# Patient Record
Sex: Female | Born: 1975 | Race: Black or African American | Hispanic: No | Marital: Married | Smoking: Current some day smoker
Health system: Southern US, Community
[De-identification: ages and names within clinical notes are randomized; demographics above are authoritative.]

## PROBLEM LIST (undated history)

## (undated) DIAGNOSIS — O99119 Other diseases of the blood and blood-forming organs and certain disorders involving the immune mechanism complicating pregnancy, unspecified trimester: Secondary | ICD-10-CM

## (undated) DIAGNOSIS — D62 Acute posthemorrhagic anemia: Secondary | ICD-10-CM

## (undated) DIAGNOSIS — D696 Thrombocytopenia, unspecified: Secondary | ICD-10-CM

## (undated) HISTORY — PX: ANKLE FRACTURE SURGERY: SHX122

---

## 2010-07-02 ENCOUNTER — Inpatient Hospital Stay (HOSPITAL_COMMUNITY)
Admission: AD | Admit: 2010-07-02 | Discharge: 2010-07-02 | Payer: Self-pay | Source: Home / Self Care | Attending: Obstetrics and Gynecology | Admitting: Obstetrics and Gynecology

## 2010-07-23 LAB — HIV ANTIBODY (ROUTINE TESTING W REFLEX): HIV: NONREACTIVE

## 2010-07-23 LAB — RPR: RPR: NONREACTIVE

## 2010-11-27 ENCOUNTER — Other Ambulatory Visit (HOSPITAL_COMMUNITY): Payer: Self-pay | Admitting: Obstetrics and Gynecology

## 2010-11-27 ENCOUNTER — Ambulatory Visit (HOSPITAL_COMMUNITY): Payer: Self-pay

## 2010-11-27 ENCOUNTER — Encounter (HOSPITAL_COMMUNITY): Payer: Self-pay

## 2010-11-27 ENCOUNTER — Other Ambulatory Visit (HOSPITAL_COMMUNITY): Payer: Self-pay

## 2010-11-27 ENCOUNTER — Ambulatory Visit (HOSPITAL_COMMUNITY)
Admission: RE | Admit: 2010-11-27 | Discharge: 2010-11-27 | Disposition: A | Discharge status: Home / Self Care | Payer: BC Managed Care – PPO | Source: Ambulatory Visit | Attending: Obstetrics and Gynecology | Admitting: Obstetrics and Gynecology

## 2010-11-27 DIAGNOSIS — O2690 Pregnancy related conditions, unspecified, unspecified trimester: Secondary | ICD-10-CM

## 2010-11-27 DIAGNOSIS — O365990 Maternal care for other known or suspected poor fetal growth, unspecified trimester, not applicable or unspecified: Secondary | ICD-10-CM | POA: Insufficient documentation

## 2010-11-27 DIAGNOSIS — O09529 Supervision of elderly multigravida, unspecified trimester: Secondary | ICD-10-CM | POA: Insufficient documentation

## 2010-12-04 ENCOUNTER — Other Ambulatory Visit (HOSPITAL_COMMUNITY): Payer: BC Managed Care – PPO

## 2010-12-04 ENCOUNTER — Other Ambulatory Visit (HOSPITAL_COMMUNITY): Payer: Self-pay | Admitting: Obstetrics and Gynecology

## 2010-12-04 ENCOUNTER — Ambulatory Visit (HOSPITAL_COMMUNITY)
Admission: RE | Admit: 2010-12-04 | Discharge: 2010-12-04 | Disposition: A | Discharge status: Home / Self Care | Payer: BC Managed Care – PPO | Source: Ambulatory Visit | Attending: Obstetrics and Gynecology | Admitting: Obstetrics and Gynecology

## 2010-12-04 DIAGNOSIS — O365990 Maternal care for other known or suspected poor fetal growth, unspecified trimester, not applicable or unspecified: Secondary | ICD-10-CM | POA: Insufficient documentation

## 2010-12-04 DIAGNOSIS — O09529 Supervision of elderly multigravida, unspecified trimester: Secondary | ICD-10-CM | POA: Insufficient documentation

## 2010-12-04 DIAGNOSIS — IMO0002 Reserved for concepts with insufficient information to code with codable children: Secondary | ICD-10-CM

## 2010-12-04 DIAGNOSIS — O288 Other abnormal findings on antenatal screening of mother: Secondary | ICD-10-CM

## 2010-12-04 DIAGNOSIS — O2690 Pregnancy related conditions, unspecified, unspecified trimester: Secondary | ICD-10-CM

## 2010-12-11 ENCOUNTER — Other Ambulatory Visit (HOSPITAL_COMMUNITY): Payer: Self-pay | Admitting: Obstetrics and Gynecology

## 2010-12-11 ENCOUNTER — Ambulatory Visit (HOSPITAL_COMMUNITY)
Admission: RE | Admit: 2010-12-11 | Discharge: 2010-12-11 | Disposition: A | Discharge status: Home / Self Care | Payer: BC Managed Care – PPO | Source: Ambulatory Visit | Attending: Obstetrics and Gynecology | Admitting: Obstetrics and Gynecology

## 2010-12-11 DIAGNOSIS — O365990 Maternal care for other known or suspected poor fetal growth, unspecified trimester, not applicable or unspecified: Secondary | ICD-10-CM | POA: Insufficient documentation

## 2010-12-11 DIAGNOSIS — IMO0002 Reserved for concepts with insufficient information to code with codable children: Secondary | ICD-10-CM

## 2010-12-11 DIAGNOSIS — O288 Other abnormal findings on antenatal screening of mother: Secondary | ICD-10-CM

## 2010-12-11 DIAGNOSIS — O09529 Supervision of elderly multigravida, unspecified trimester: Secondary | ICD-10-CM | POA: Insufficient documentation

## 2010-12-18 ENCOUNTER — Ambulatory Visit (HOSPITAL_COMMUNITY)
Admission: RE | Admit: 2010-12-18 | Discharge: 2010-12-18 | Disposition: A | Discharge status: Home / Self Care | Payer: BC Managed Care – PPO | Source: Ambulatory Visit | Attending: Obstetrics and Gynecology | Admitting: Obstetrics and Gynecology

## 2010-12-18 ENCOUNTER — Other Ambulatory Visit (HOSPITAL_COMMUNITY): Payer: Self-pay | Admitting: Obstetrics and Gynecology

## 2010-12-18 DIAGNOSIS — O09529 Supervision of elderly multigravida, unspecified trimester: Secondary | ICD-10-CM | POA: Insufficient documentation

## 2010-12-18 DIAGNOSIS — IMO0002 Reserved for concepts with insufficient information to code with codable children: Secondary | ICD-10-CM

## 2010-12-18 DIAGNOSIS — O365990 Maternal care for other known or suspected poor fetal growth, unspecified trimester, not applicable or unspecified: Secondary | ICD-10-CM | POA: Insufficient documentation

## 2010-12-25 ENCOUNTER — Other Ambulatory Visit (HOSPITAL_COMMUNITY): Payer: Self-pay | Admitting: Obstetrics and Gynecology

## 2010-12-25 ENCOUNTER — Ambulatory Visit (HOSPITAL_COMMUNITY)
Admission: RE | Admit: 2010-12-25 | Discharge: 2010-12-25 | Disposition: A | Discharge status: Home / Self Care | Payer: BC Managed Care – PPO | Source: Ambulatory Visit | Attending: Obstetrics and Gynecology | Admitting: Obstetrics and Gynecology

## 2010-12-25 DIAGNOSIS — O365990 Maternal care for other known or suspected poor fetal growth, unspecified trimester, not applicable or unspecified: Secondary | ICD-10-CM | POA: Insufficient documentation

## 2010-12-25 DIAGNOSIS — O99330 Smoking (tobacco) complicating pregnancy, unspecified trimester: Secondary | ICD-10-CM | POA: Insufficient documentation

## 2010-12-25 DIAGNOSIS — IMO0002 Reserved for concepts with insufficient information to code with codable children: Secondary | ICD-10-CM

## 2010-12-25 DIAGNOSIS — O288 Other abnormal findings on antenatal screening of mother: Secondary | ICD-10-CM

## 2010-12-25 DIAGNOSIS — O09529 Supervision of elderly multigravida, unspecified trimester: Secondary | ICD-10-CM | POA: Insufficient documentation

## 2010-12-31 ENCOUNTER — Other Ambulatory Visit (HOSPITAL_COMMUNITY): Payer: Self-pay | Admitting: Obstetrics and Gynecology

## 2010-12-31 DIAGNOSIS — IMO0002 Reserved for concepts with insufficient information to code with codable children: Secondary | ICD-10-CM

## 2011-01-01 ENCOUNTER — Encounter (HOSPITAL_COMMUNITY): Payer: Self-pay

## 2011-01-01 ENCOUNTER — Ambulatory Visit (HOSPITAL_COMMUNITY): Payer: BC Managed Care – PPO

## 2011-01-01 ENCOUNTER — Ambulatory Visit (HOSPITAL_COMMUNITY)
Admission: RE | Admit: 2011-01-01 | Discharge: 2011-01-01 | Disposition: A | Discharge status: Home / Self Care | Payer: BC Managed Care – PPO | Source: Ambulatory Visit | Attending: Obstetrics and Gynecology | Admitting: Obstetrics and Gynecology

## 2011-01-01 DIAGNOSIS — O09529 Supervision of elderly multigravida, unspecified trimester: Secondary | ICD-10-CM | POA: Insufficient documentation

## 2011-01-01 DIAGNOSIS — O99330 Smoking (tobacco) complicating pregnancy, unspecified trimester: Secondary | ICD-10-CM | POA: Insufficient documentation

## 2011-01-01 DIAGNOSIS — IMO0002 Reserved for concepts with insufficient information to code with codable children: Secondary | ICD-10-CM

## 2011-01-01 DIAGNOSIS — O288 Other abnormal findings on antenatal screening of mother: Secondary | ICD-10-CM

## 2011-01-01 DIAGNOSIS — O365990 Maternal care for other known or suspected poor fetal growth, unspecified trimester, not applicable or unspecified: Secondary | ICD-10-CM | POA: Insufficient documentation

## 2011-01-08 ENCOUNTER — Other Ambulatory Visit (HOSPITAL_COMMUNITY): Payer: Self-pay | Admitting: Obstetrics and Gynecology

## 2011-01-08 ENCOUNTER — Other Ambulatory Visit (HOSPITAL_COMMUNITY): Payer: BC Managed Care – PPO

## 2011-01-08 ENCOUNTER — Ambulatory Visit (HOSPITAL_COMMUNITY)
Admission: RE | Admit: 2011-01-08 | Discharge: 2011-01-08 | Disposition: A | Discharge status: Home / Self Care | Payer: BC Managed Care – PPO | Source: Ambulatory Visit | Attending: Obstetrics and Gynecology | Admitting: Obstetrics and Gynecology

## 2011-01-08 DIAGNOSIS — IMO0002 Reserved for concepts with insufficient information to code with codable children: Secondary | ICD-10-CM

## 2011-01-08 DIAGNOSIS — O09529 Supervision of elderly multigravida, unspecified trimester: Secondary | ICD-10-CM | POA: Insufficient documentation

## 2011-01-08 DIAGNOSIS — O365990 Maternal care for other known or suspected poor fetal growth, unspecified trimester, not applicable or unspecified: Secondary | ICD-10-CM | POA: Insufficient documentation

## 2011-01-08 DIAGNOSIS — O99330 Smoking (tobacco) complicating pregnancy, unspecified trimester: Secondary | ICD-10-CM | POA: Insufficient documentation

## 2011-01-08 NOTE — Progress Notes (Signed)
Patient seen for ultrasound only appointment today.  Please see [facility]-OBGYN report for details.  

## 2011-01-15 ENCOUNTER — Ambulatory Visit (HOSPITAL_COMMUNITY): Payer: BC Managed Care – PPO

## 2011-01-15 ENCOUNTER — Other Ambulatory Visit (HOSPITAL_COMMUNITY): Payer: BC Managed Care – PPO

## 2011-01-17 ENCOUNTER — Encounter (HOSPITAL_COMMUNITY)
Admission: AD | Disposition: A | Discharge status: Home / Self Care | Payer: Self-pay | Source: Ambulatory Visit | Attending: Obstetrics

## 2011-01-17 ENCOUNTER — Encounter (HOSPITAL_COMMUNITY): Payer: Self-pay | Admitting: *Deleted

## 2011-01-17 ENCOUNTER — Other Ambulatory Visit (HOSPITAL_COMMUNITY): Payer: Self-pay | Admitting: Obstetrics and Gynecology

## 2011-01-17 ENCOUNTER — Inpatient Hospital Stay (HOSPITAL_COMMUNITY): Payer: BC Managed Care – PPO | Admitting: Anesthesiology

## 2011-01-17 ENCOUNTER — Inpatient Hospital Stay (HOSPITAL_COMMUNITY)
Admission: AD | Admit: 2011-01-17 | Discharge: 2011-01-20 | DRG: 370 | Disposition: A | Discharge status: Home / Self Care | Payer: BC Managed Care – PPO | Source: Ambulatory Visit | Attending: Obstetrics | Admitting: Obstetrics

## 2011-01-17 ENCOUNTER — Encounter (HOSPITAL_COMMUNITY): Payer: Self-pay | Admitting: Anesthesiology

## 2011-01-17 ENCOUNTER — Ambulatory Visit (HOSPITAL_COMMUNITY)
Admission: RE | Admit: 2011-01-17 | Discharge: 2011-01-17 | Disposition: A | Discharge status: Home / Self Care | Payer: BC Managed Care – PPO | Source: Ambulatory Visit | Attending: Obstetrics and Gynecology | Admitting: Obstetrics and Gynecology

## 2011-01-17 DIAGNOSIS — O365990 Maternal care for other known or suspected poor fetal growth, unspecified trimester, not applicable or unspecified: Secondary | ICD-10-CM | POA: Insufficient documentation

## 2011-01-17 DIAGNOSIS — D696 Thrombocytopenia, unspecified: Secondary | ICD-10-CM | POA: Diagnosis present

## 2011-01-17 DIAGNOSIS — O09529 Supervision of elderly multigravida, unspecified trimester: Secondary | ICD-10-CM | POA: Insufficient documentation

## 2011-01-17 DIAGNOSIS — O288 Other abnormal findings on antenatal screening of mother: Secondary | ICD-10-CM

## 2011-01-17 DIAGNOSIS — O99119 Other diseases of the blood and blood-forming organs and certain disorders involving the immune mechanism complicating pregnancy, unspecified trimester: Secondary | ICD-10-CM | POA: Diagnosis present

## 2011-01-17 DIAGNOSIS — D689 Coagulation defect, unspecified: Secondary | ICD-10-CM | POA: Diagnosis not present

## 2011-01-17 DIAGNOSIS — IMO0002 Reserved for concepts with insufficient information to code with codable children: Secondary | ICD-10-CM

## 2011-01-17 DIAGNOSIS — O99330 Smoking (tobacco) complicating pregnancy, unspecified trimester: Secondary | ICD-10-CM | POA: Insufficient documentation

## 2011-01-17 HISTORY — DX: Thrombocytopenia, unspecified: D69.6

## 2011-01-17 HISTORY — DX: Other diseases of the blood and blood-forming organs and certain disorders involving the immune mechanism complicating pregnancy, unspecified trimester: O99.119

## 2011-01-17 LAB — CBC
HCT: 35.9 % — ABNORMAL LOW (ref 36.0–46.0)
Hemoglobin: 12.1 g/dL (ref 12.0–15.0)
WBC: 11.2 10*3/uL — ABNORMAL HIGH (ref 4.0–10.5)

## 2011-01-17 LAB — TYPE AND SCREEN
ABO/RH(D): B POS
Antibody Screen: NEGATIVE
Antibody Screen: NEGATIVE

## 2011-01-17 LAB — HEPATITIS B SURFACE ANTIGEN: Hepatitis B Surface Ag: NEGATIVE

## 2011-01-17 LAB — STREP B DNA PROBE

## 2011-01-17 LAB — HIV ANTIBODY (ROUTINE TESTING W REFLEX): HIV: NONREACTIVE

## 2011-01-17 SURGERY — Surgical Case
Anesthesia: Spinal | Site: Abdomen | Wound class: Clean Contaminated

## 2011-01-17 MED ORDER — MORPHINE SULFATE 0.5 MG/ML IJ SOLN
INTRAMUSCULAR | Status: AC
  Filled 2011-01-17: qty 10

## 2011-01-17 MED ORDER — BUPIVACAINE IN DEXTROSE 0.75-8.25 % IT SOLN
INTRATHECAL | Status: DC | PRN
  Administered 2011-01-17: 1.5 mL via INTRATHECAL

## 2011-01-17 MED ORDER — OXYTOCIN 20 UNITS IN LACTATED RINGERS INFUSION - SIMPLE
INTRAVENOUS | Status: DC | PRN
  Administered 2011-01-17: 20 [IU] via INTRAVENOUS

## 2011-01-17 MED ORDER — OXYTOCIN 20 UNITS IN LACTATED RINGERS INFUSION - SIMPLE
INTRAVENOUS | Status: AC
  Filled 2011-01-17: qty 1000

## 2011-01-17 MED ORDER — CITRIC ACID-SODIUM CITRATE 334-500 MG/5ML PO SOLN
ORAL | Status: AC
  Administered 2011-01-17: 30 mL
  Filled 2011-01-17: qty 15

## 2011-01-17 MED ORDER — FENTANYL CITRATE 0.05 MG/ML IJ SOLN
INTRAMUSCULAR | Status: AC
  Filled 2011-01-17: qty 2

## 2011-01-17 MED ORDER — EPHEDRINE SULFATE 50 MG/ML IJ SOLN
INTRAMUSCULAR | Status: DC | PRN
  Administered 2011-01-17: 15 mg via INTRAVENOUS

## 2011-01-17 MED ORDER — MORPHINE SULFATE (PF) 0.5 MG/ML IJ SOLN
INTRAMUSCULAR | Status: DC | PRN
  Administered 2011-01-17: .1 mg via INTRATHECAL

## 2011-01-17 MED ORDER — ONDANSETRON HCL 4 MG/2ML IJ SOLN
INTRAMUSCULAR | Status: AC
  Filled 2011-01-17: qty 2

## 2011-01-17 MED ORDER — ONDANSETRON HCL 4 MG/2ML IJ SOLN
INTRAMUSCULAR | Status: DC | PRN
  Administered 2011-01-17: 4 mg via INTRAVENOUS

## 2011-01-17 MED ORDER — OXYTOCIN 10 UNIT/ML IJ SOLN
INTRAMUSCULAR | Status: AC
  Filled 2011-01-17: qty 2

## 2011-01-17 MED ORDER — EPHEDRINE 5 MG/ML INJ
INTRAVENOUS | Status: AC
  Filled 2011-01-17: qty 10

## 2011-01-17 MED ORDER — LACTATED RINGERS IV SOLN
INTRAVENOUS | Status: DC
  Administered 2011-01-17 (×4): via INTRAVENOUS

## 2011-01-17 MED ORDER — FENTANYL CITRATE 0.05 MG/ML IJ SOLN
INTRAMUSCULAR | Status: DC | PRN
  Administered 2011-01-17: 15 ug via INTRATHECAL

## 2011-01-17 SURGICAL SUPPLY — 30 items
CHLORAPREP W/TINT 26ML (MISCELLANEOUS) ×2 IMPLANT
CLOTH BEACON ORANGE TIMEOUT ST (SAFETY) ×2 IMPLANT
CONTAINER PREFILL 10% NBF 15ML (MISCELLANEOUS) IMPLANT
DRSG COVADERM 4X8 (GAUZE/BANDAGES/DRESSINGS) ×2 IMPLANT
ELECT REM PT RETURN 9FT ADLT (ELECTROSURGICAL) ×2
ELECTRODE REM PT RTRN 9FT ADLT (ELECTROSURGICAL) ×1 IMPLANT
EXTRACTOR VACUUM KIWI (MISCELLANEOUS) IMPLANT
EXTRACTOR VACUUM M CUP 4 TUBE (SUCTIONS) IMPLANT
GLOVE BIO SURGEON STRL SZ 6.5 (GLOVE) ×2 IMPLANT
GLOVE BIOGEL PI IND STRL 7.0 (GLOVE) ×2 IMPLANT
GLOVE BIOGEL PI INDICATOR 7.0 (GLOVE) ×2
GOWN PREVENTION PLUS LG XLONG (DISPOSABLE) ×6 IMPLANT
KIT ABG SYR 3ML LUER SLIP (SYRINGE) IMPLANT
NEEDLE HYPO 25X5/8 SAFETYGLIDE (NEEDLE) IMPLANT
NS IRRIG 1000ML POUR BTL (IV SOLUTION) ×2 IMPLANT
PACK C SECTION WH (CUSTOM PROCEDURE TRAY) ×2 IMPLANT
SLEEVE SCD COMPRESS KNEE MED (MISCELLANEOUS) IMPLANT
STAPLER VISISTAT 35W (STAPLE) IMPLANT
STRIP CLOSURE SKIN 1/2X4 (GAUZE/BANDAGES/DRESSINGS) IMPLANT
SUT MON AB 4-0 PS1 27 (SUTURE) IMPLANT
SUT PLAIN 0 NONE (SUTURE) IMPLANT
SUT PLAIN 2 0 XLH (SUTURE) IMPLANT
SUT VIC AB 0 CT1 36 (SUTURE) ×4 IMPLANT
SUT VIC AB 0 CTX 36 (SUTURE) ×3
SUT VIC AB 0 CTX36XBRD ANBCTRL (SUTURE) ×3 IMPLANT
SUT VIC AB 2-0 CT1 27 (SUTURE) ×1
SUT VIC AB 2-0 CT1 TAPERPNT 27 (SUTURE) ×1 IMPLANT
TOWEL OR 17X24 6PK STRL BLUE (TOWEL DISPOSABLE) ×4 IMPLANT
TRAY FOLEY CATH 14FR (SET/KITS/TRAYS/PACK) IMPLANT
WATER STERILE IRR 1000ML POUR (IV SOLUTION) ×2 IMPLANT

## 2011-01-17 NOTE — Anesthesia Procedure Notes (Signed)
Spinal Block  Patient location during procedure: OR Start time: 01/17/2011 11:20 PM Staffing Performed by: anesthesiologist  Preanesthetic Checklist Completed: patient identified, site marked, surgical consent, pre-op evaluation, timeout performed, IV checked, risks and benefits discussed and monitors and equipment checked Spinal Block Patient position: sitting Prep: DuraPrep Patient monitoring: heart rate, cardiac monitor, continuous pulse ox and blood pressure Approach: midline Location: L3-4 Injection technique: single-shot Needle Needle type: Sprotte  Needle gauge: 24 G Needle length: 5 cm Assessment Sensory level: T4 Additional Notes One attempt.

## 2011-01-17 NOTE — H&P (Signed)
[name] [name] is a 35 y.o. G4P1021 at [redacted]w[redacted]d by LMP c/w 6 wk u/s, presenting for fetal eval given reversed EDF seen on u/s earlier today. Pt has been followed for IUGR since 27 wks, unexplained, and has been getting twice weekly UA dopplers. Fetal testing has remained stable until today, when decreased AFI (5.6) absent with reversed EDF noted. BPP 8/8 but given that pt is greater than 34 wks, s/p BMZ and reversal of flow, discussion was had with Dr. Tona Sensing and decision made to move toward delivery. Pt notes no contractions . Good fetal movement, No vaginal bleeding, No leaking fluid.  [facility] at [name] since 6 wks - marginal cord insertion - IUGR as noted - DS 166, nl 3 hr GTT  OB History    Grav Para Term Preterm Abortions TAB SAB Ect Mult Living   4 1 1  0 2 1 1  0 0 1     History reviewed. 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 No pertinent past surgical history. Family History: family history is not on file. Social History:  reports that she has been smoking Cigarettes.  She has a 2.5 pack-year smoking history. She has never used smokeless tobacco. She reports that she does not drink alcohol or use illicit drugs.  Review of Systems - Negative     Blood pressure 122/80, pulse 64, temperature 98.6 F (37 C), temperature source Oral, resp. rate 20, height 5\' 2"  (1.575 m), weight 89.812 kg (198 lb), last menstrual period 05/18/2010.  Physical Exam:  Vitals noted Gen- well appearing, no distress CV_ RRR Pulm- CTAB Abd- soft, gravid, NT GU: cvx long /closed/ vtx high Toco: rare ctx FH: 130's, + accels, 10 beat variability  (after initial hour where minimal variability), variable decel w/ rare ctx, times durin NST where FH audibly stops for up to 10 seconds (maternal FH in 90's traces) then resumes. Decel when pt on back. LE: NT, no edema  U/s: MFM u/s today: BPP 8/8, absent w/ reversed EDF, AFI 5.6 but subjectively decreased and lower  than prior U/s: MFM 7/24: 3'7, vtx  Prenatal labs: ABO, Rh:  B+ Antibody: Negative (08/02 0000) Rubella:  RI RPR: Nonreactive, Nonreactive (08/02 0000)  HBsAg: Negative (08/02 0000)  HIV: Non-reactive (08/02 0000)  GBS: Unknown (08/02 0000)  1 hr Glucola 166, nl 3 hr  Genetic screening nl ultrascreen, AFP neg Anatomy US normal   Assessment/Plan: 34 y.o. [id number] at [redacted]w[redacted]d FWB- IUGR since 27 wks, now w/ new drops in AFI along with absent and reversed EDF. Plan to move to delivery when weighing risks of continued expectant management with risks of prematurity at 34+ weeks. Pt has also refused inpt monitoring in past and risk of pt non-compliance with monitoring worrisome. Dr. Tona Sensing in agreement with decision for delivery.  - MOD. D/w pt option for CST and IOL if passes CST vs PCS. As she has already had decels with rare ctx, had decel with position change and audible stops in FH during NST, along with unripe cvx we have made the decision to proceed with PCS. We discussed her favorable factor of prior vaginal delivery, though this was 12 yrs ago.    - Ancef prophylaxis, GBS unknown  Abdallah Hern A. 01/17/2011, 10:50 PM

## 2011-01-17 NOTE — Anesthesia Preprocedure Evaluation (Signed)
Anesthesia Evaluation  Name, MR# and DOB Patient awake  General Assessment Comment  Reviewed: Allergy & Precautions, H&P , Patient's Chart, lab work & pertinent test results and reviewed documented beta blocker date and time   History of Anesthesia Complications Negative for: history of anesthetic complications  Airway Mallampati: III TM Distance: >3 FB Neck ROM: full    Dental  (+) Teeth Intact   Pulmonary  clear to auscultation    Cardiovascular regular Normal   Neuro/Psych  GI/Hepatic/Renal   Endo/Other   Abdominal   Musculoskeletal  Hematology Thrombocytopenia noted today for the first time - 108   Peds  Reproductive/Obstetrics (+) Pregnancy   Anesthesia Other Findings 34 weeks, severe IUGR, absent EDF, failed CST with NRFS - now for urgent C/S            Anesthesia Physical Anesthesia Plan  ASA: II and Emergent  Anesthesia Plan: Spinal   Post-op Pain Management:    Induction:   Airway Management Planned:   Additional Equipment:   Intra-op Plan:   Post-operative Plan:   Informed Consent: I have reviewed the patients History and Physical, chart, labs and discussed the procedure including the risks, benefits and alternatives for the proposed anesthesia with the patient or authorized representative who has indicated his/her understanding and acceptance.     Plan Discussed with: Surgeon and CRNA  Anesthesia Plan Comments:         Anesthesia Quick Evaluation

## 2011-01-17 NOTE — Progress Notes (Signed)
Report in [facility]-OBGYN/EPIC; IUGR with decreasing AFI and abnormal Doppler velocimetry.  Biophysical profile is 8/8 Recommended hospitalization for further assessment, possible delivery.

## 2011-01-17 NOTE — Consult Note (Signed)
Called to attend this estimated 34.[redacted] week gestation delivery with past h/o severe IUGR presenting tonight with reversed end-diastolic flow and non reassuring FHR.  Mother received BMZ @ ~ [redacted] weeks gestation.  Estimated fetal weight is 3 # 7 oz ~ 7 days ago.  Mother seen by MFM consultant yesterday and based on the observation of reversed end diastolic flow and perhaps other observations, recommendation was made to hospitalize mother for closer observation. Admitted to hospital today and on FHR monitoring observed to have hon reassuring tracing. On this basis decision to perform urgent controlled C/S.Marland Kitchen  At delivery membranes were ruptured (clear) and infant extracted from a vertex presentation. INfant immediately had a cry and active movement of upper extremities. Repeated cries and active MAE observed when placed under radiant warmer. Skin dried vigorously with room temperature having been raised for the brief trip from the OR table, naso/oropharynx had copious secretions that required repeated bulb suctioning. Infant transferred to a prewarmed dry blanket and observed for a normal transition of 5 minutes which passed reasonably well.  She did require the additional of BBO2 beginning at 2 minutes of age; this was continued through transport to the NICU.   She voided during this interval.    Discussed details of management with father once arriving in NICU and addressed his questions.

## 2011-01-18 ENCOUNTER — Encounter (HOSPITAL_COMMUNITY): Payer: Self-pay | Admitting: Anesthesiology

## 2011-01-18 ENCOUNTER — Ambulatory Visit (HOSPITAL_COMMUNITY): Payer: BC Managed Care – PPO

## 2011-01-18 ENCOUNTER — Other Ambulatory Visit: Payer: Self-pay | Admitting: Obstetrics

## 2011-01-18 ENCOUNTER — Encounter (HOSPITAL_COMMUNITY): Payer: Self-pay | Admitting: *Deleted

## 2011-01-18 DIAGNOSIS — O365990 Maternal care for other known or suspected poor fetal growth, unspecified trimester, not applicable or unspecified: Secondary | ICD-10-CM

## 2011-01-18 LAB — CBC
HCT: 32.9 % — ABNORMAL LOW (ref 36.0–46.0)
MCH: 27.3 pg (ref 26.0–34.0)
MCV: 82.5 fL (ref 78.0–100.0)
Platelets: 91 10*3/uL — ABNORMAL LOW (ref 150–400)
RDW: 14.7 % (ref 11.5–15.5)

## 2011-01-18 MED ORDER — ZOLPIDEM TARTRATE 5 MG PO TABS: 5.0000 mg | ORAL_TABLET | Freq: Every evening | ORAL | Status: DC | PRN

## 2011-01-18 MED ORDER — DIPHENHYDRAMINE HCL 25 MG PO CAPS: 25.0000 mg | ORAL_CAPSULE | ORAL | Status: DC | PRN

## 2011-01-18 MED ORDER — LANOLIN HYDROUS EX OINT: 1.0000 "application " | TOPICAL_OINTMENT | CUTANEOUS | Status: DC | PRN

## 2011-01-18 MED ORDER — PRENATAL PLUS 27-1 MG PO TABS
1.0000 | ORAL_TABLET | Freq: Every day | ORAL | Status: DC
  Administered 2011-01-18 – 2011-01-20 (×3): 1 via ORAL
  Filled 2011-01-18 (×3): qty 1

## 2011-01-18 MED ORDER — ONDANSETRON HCL 4 MG/2ML IJ SOLN: 4.0000 mg | INTRAMUSCULAR | Status: DC | PRN

## 2011-01-18 MED ORDER — SCOPOLAMINE 1 MG/3DAYS TD PT72
1.0000 | MEDICATED_PATCH | Freq: Once | TRANSDERMAL | Status: DC
  Administered 2011-01-18: 1.5 mg via TRANSDERMAL

## 2011-01-18 MED ORDER — IBUPROFEN 600 MG PO TABS
600.0000 mg | ORAL_TABLET | Freq: Four times a day (QID) | ORAL | Status: DC
  Administered 2011-01-18: 600 mg via ORAL
  Filled 2011-01-18 (×2): qty 1

## 2011-01-18 MED ORDER — SENNOSIDES-DOCUSATE SODIUM 8.6-50 MG PO TABS
2.0000 | ORAL_TABLET | Freq: Every day | ORAL | Status: DC
  Administered 2011-01-18 – 2011-01-19 (×2): 2 via ORAL

## 2011-01-18 MED ORDER — MENTHOL 3 MG MT LOZG: 1.0000 | LOZENGE | OROMUCOSAL | Status: DC | PRN

## 2011-01-18 MED ORDER — TETANUS-DIPHTH-ACELL PERTUSSIS 5-2.5-18.5 LF-MCG/0.5 IM SUSP
0.5000 mL | Freq: Once | INTRAMUSCULAR | Status: AC
  Administered 2011-01-20: 0.5 mL via INTRAMUSCULAR
  Filled 2011-01-18: qty 0.5

## 2011-01-18 MED ORDER — NALOXONE HCL 0.4 MG/ML IJ SOLN: 0.4000 mg | INTRAMUSCULAR | Status: DC | PRN

## 2011-01-18 MED ORDER — ONDANSETRON HCL 4 MG PO TABS: 4.0000 mg | ORAL_TABLET | ORAL | Status: DC | PRN

## 2011-01-18 MED ORDER — SODIUM CHLORIDE 0.9 % IV SOLN
1.0000 ug/kg/h | INTRAVENOUS | Status: DC | PRN
  Filled 2011-01-18: qty 2.5

## 2011-01-18 MED ORDER — ONDANSETRON HCL 4 MG/2ML IJ SOLN: 4.0000 mg | Freq: Three times a day (TID) | INTRAMUSCULAR | Status: DC | PRN

## 2011-01-18 MED ORDER — NALBUPHINE HCL 10 MG/ML IJ SOLN
5.0000 mg | INTRAMUSCULAR | Status: DC | PRN
  Filled 2011-01-18: qty 1

## 2011-01-18 MED ORDER — DIPHENHYDRAMINE HCL 50 MG/ML IJ SOLN: 25.0000 mg | INTRAMUSCULAR | Status: DC | PRN

## 2011-01-18 MED ORDER — DIPHENHYDRAMINE HCL 25 MG PO CAPS: 25.0000 mg | ORAL_CAPSULE | Freq: Four times a day (QID) | ORAL | Status: DC | PRN

## 2011-01-18 MED ORDER — WITCH HAZEL-GLYCERIN EX PADS: 1.0000 "application " | MEDICATED_PAD | CUTANEOUS | Status: DC | PRN

## 2011-01-18 MED ORDER — MEPERIDINE HCL 25 MG/ML IJ SOLN: 6.2500 mg | INTRAMUSCULAR | Status: DC | PRN

## 2011-01-18 MED ORDER — OXYCODONE-ACETAMINOPHEN 5-325 MG PO TABS
1.0000 | ORAL_TABLET | ORAL | Status: DC | PRN
  Administered 2011-01-18: 1 via ORAL
  Administered 2011-01-18: 2 via ORAL
  Administered 2011-01-18: 1 via ORAL
  Administered 2011-01-19: 2 via ORAL
  Administered 2011-01-19: 1 via ORAL
  Administered 2011-01-19 – 2011-01-20 (×2): 2 via ORAL
  Filled 2011-01-18: qty 1
  Filled 2011-01-18: qty 2
  Filled 2011-01-18: qty 1
  Filled 2011-01-18: qty 2
  Filled 2011-01-18: qty 1
  Filled 2011-01-18 (×2): qty 2

## 2011-01-18 MED ORDER — HYDROMORPHONE HCL 1 MG/ML IJ SOLN: 0.2500 mg | INTRAMUSCULAR | Status: DC | PRN

## 2011-01-18 MED ORDER — DIBUCAINE 1 % RE OINT: 1.0000 "application " | TOPICAL_OINTMENT | RECTAL | Status: DC | PRN

## 2011-01-18 MED ORDER — SIMETHICONE 80 MG PO CHEW
80.0000 mg | CHEWABLE_TABLET | Freq: Three times a day (TID) | ORAL | Status: DC
  Administered 2011-01-18 – 2011-01-20 (×9): 80 mg via ORAL

## 2011-01-18 MED ORDER — SIMETHICONE 80 MG PO CHEW: 80.0000 mg | CHEWABLE_TABLET | ORAL | Status: DC | PRN

## 2011-01-18 MED ORDER — OXYTOCIN 20 UNITS IN LACTATED RINGERS INFUSION - SIMPLE
INTRAVENOUS | Status: AC
  Administered 2011-01-18: 20000 m[IU] via INTRAVENOUS
  Filled 2011-01-18: qty 1000

## 2011-01-18 MED ORDER — METOCLOPRAMIDE HCL 5 MG/ML IJ SOLN: 10.0000 mg | Freq: Three times a day (TID) | INTRAMUSCULAR | Status: DC | PRN

## 2011-01-18 MED ORDER — DIPHENHYDRAMINE HCL 50 MG/ML IJ SOLN: 12.5000 mg | INTRAMUSCULAR | Status: DC | PRN

## 2011-01-18 MED ORDER — OXYTOCIN 20 UNITS IN LACTATED RINGERS INFUSION - SIMPLE
125.0000 mL/h | INTRAVENOUS | Status: AC
  Administered 2011-01-18: 20000 m[IU] via INTRAVENOUS

## 2011-01-18 MED ORDER — IBUPROFEN 600 MG PO TABS: 600.0000 mg | ORAL_TABLET | Freq: Four times a day (QID) | ORAL | Status: DC | PRN

## 2011-01-18 MED ORDER — SCOPOLAMINE 1 MG/3DAYS TD PT72
MEDICATED_PATCH | TRANSDERMAL | Status: AC
  Administered 2011-01-18: 1.5 mg via TRANSDERMAL
  Filled 2011-01-18: qty 1

## 2011-01-18 MED ORDER — SODIUM CHLORIDE 0.9 % IJ SOLN: 3.0000 mL | INTRAMUSCULAR | Status: DC | PRN

## 2011-01-18 MED ORDER — CEFAZOLIN SODIUM 1-5 GM-% IV SOLN
INTRAVENOUS | Status: AC
  Filled 2011-01-18: qty 50

## 2011-01-18 MED ORDER — LACTATED RINGERS IV SOLN
INTRAVENOUS | Status: DC
  Administered 2011-01-18: 10:00:00 via INTRAVENOUS

## 2011-01-18 NOTE — Progress Notes (Signed)
Preoperative diagnosis: IUGR, 34 wks, fetal decel  Postop diagnosis:  same Procedure: PCS Surgeon: Viviann Spare Assistant:. none Anesthesia: spinal Findings: female infant, wt 3'3, cord ph 7.2, nl uterus, nl tubes, nl ovaries, clear AF,  APGAR (1 MIN): 7   APGAR (5 MINS): 9   EBL: 400 cc Antibiotics:  1 g Ancef  Complications: none  Indications: This is a 35 y.o. year-old, [id number]  At [redacted]w[redacted]d admitted for non-reasurring fetal testing. Risks benefits and alternatives of the procedure were discussed with the patient who agreed to proceed  Procedure:  After informed consent was obtained the patient was taken to the operating room where spinal anesthesia was initiated.  She was prepped and draped in the normal sterile fashion in dorsal supine position with a leftward tilt.  A foley catheter was inserted sterilely into the bladder. Gloves were changes and attention was turned to the patient's abdomen. A Pfannenstiel skin incision was made 2 cm above the pubic symphysis in the midline with the scalpel.  Dissection was carried down with the Bovie cautery until the fascia was reached. The fascia was incised in the midline. The incision was extended laterally with the Mayo scissors. The inferior aspect of the fascial incision was grasped with the Coker clamps, elevated up and the underlying rectus muscles were dissected off sharply. The superior aspect of the fascial incision was grasped with the Coker clamps elevated up and the underlying rectus muscles were dissected off sharply.  The peritoneum was entered bluntly, the pyramidalis muscles were separated sharply in the midline. The peritoneal incision was extended superiorly and inferiorly with good visualization of the bladder. The bladder blade was inserted, the vesicouterine peritoneum was identified grasped with the pickups and entered sharply. The bladder flap was created digitally,  the bladder blade was reinserted. Palpation was done to assess the  fetal position and the location of the uterine vessels. Thought was made to a classical c/s but I felt there was enough lateral room given the vtx presentation to perform a LTCS. The lower segment of the uterus was incised sharply with the scalpel and extended superiorly and laterally with the bandage scissors. The infant also was grasped brought to the incision,  rotated and the infant was delivered with fundal pressure. The nose and mouth were bulb suctioned. The cord was clamped and cut. The infant was handed off to the waiting pediatrician. Cord blood and cord pH were obtained. The placenta was expressed. The uterus was exteriorized. The uterus was cleared of all clots and debris. The uterine incision was repaired with 0 Vicryl in a running locked fashion.  A second layer of the same suture was used in an imbricating fashion to obtain excellent hemostasis. Several additional figure of 8 sutures were placed in the midpoint of the incision. The uterus was then returned to the abdomen, the gutters were cleared of all clots and debris. The uterine incision was reinspected and found to be hemostatic. The peritoneum was grasped and closed with 2-0 Vicryl in a running fashion. The cut muscle edges and the underside of the fascia were inspected and found to be hemostatic. The fascia was closed with 0 Vicryl in two halves. The subcutaneous tissue was irrigated. Scarpa's layer was closed with a 2-0 plain gut suture. The skin was closed with a 4-0 Monocryl in a single layer. The patient tolerated the procedure well. Sponge lap and needle counts were correct x3 and patient was taken to the recovery room in a stable condition.  Korah Hufstedler A. 01/18/2011 12:19 AM

## 2011-01-18 NOTE — Anesthesia Postprocedure Evaluation (Signed)
  Anesthesia Post-op Note  Patient: [name] [name]  Procedure(s) Performed:  CESAREAN SECTION  No anesthesia complications.  Level of consciousness: alert. Cardiopulmonary status stable.  No follow-up care or observation required.  Jamerson Vonbargen L. Rodman Pickle, MD

## 2011-01-18 NOTE — Progress Notes (Signed)
  POD # 1  Subjective: Pt reports feeling tired and sore/ Pain controlled with ibuprofen (OTC); has not received percocet yet Tolerating po/ Foley patent and to be removed today/ No n/v/Flatus neg Activity: out of bed and ambulate Bleeding is light Newborn info:  Information for the patient's newborn:  Winnell, Bento [[id number]]  female  / Feeding: breast and bottle/Infant stable in NICU at present.   Objective: VS: Blood pressure 113/78, pulse 58, temperature 98.2 F (36.8 C), temperature source Oral, resp. rate 20  LABS:  I&O: +500   Basename 01/18/11 0640 01/17/11 2115  WBC 12.6* 11.2*  HGB 10.9* 12.1  HCT 32.9* 35.9*  PLT 91* 108*    Blood type: B POS, B POS (08/02 2208) Rubella: Immune (08/02 0000)    Physical Exam:  General: alert, cooperative and no distress CV: Regular rate and rhythm Resp: clear Abdomen: Soft, appropriately tender; hypoactive BS x 4 quads; Dressing to incision is C/D/I Uterine Fundus: firm, below umbilicus, nontender Lochia: minimal Ext: SCD's in place    A/P: POD # 1/ G4P1122/Primary C/S @ 34 d/t FITL Doing well Thrombocytopenia: D/C Ibuprofen Continue routine post op orders

## 2011-01-18 NOTE — Progress Notes (Signed)
ASSISTED PATIENT WITH PUMPING.  SHE OBTAINED 2-3 CCS OF COLOSTRUM.  PATIENT WORRIED ABOUT ENGORGEMENT DUE TO PROBLEMS WITH LAST BABY.  PATIENT STATES ENGORGEMENT DID NOT RESOLVE EASILY AND SHE LOST MILK SUPPLY.  ENCOURAGED TO CALL LC WITH ASSIST/CONCERNS.

## 2011-01-18 NOTE — Anesthesia Postprocedure Evaluation (Signed)
  Anesthesia Post-op Note  Patient: [name] [name]  Procedure(s) Performed:  CESAREAN SECTION  Patient Location: PACU and Mother/Baby  Anesthesia Type: Epidural  Level of Consciousness: awake, alert  and oriented  Airway and Oxygen Therapy: Patient Spontanous Breathing  Post-op Pain: mild  Post-op Assessment: Patient's Cardiovascular Status Stable and Respiratory Function Stable  Post-op Vital Signs: Reviewed and stable  Complications: No apparent anesthesia complications

## 2011-01-18 NOTE — Brief Op Note (Signed)
01/17/2011 - 01/18/2011  12:13 AM  PATIENT:  [name] [name]  35 y.o. female  PRE-OPERATIVE DIAGNOSIS:  Intrauterine Pregnancy At 66  Weeks; Severe Intrauterine Growth Retardation; Fetal Heart Decelarations  POST-OPERATIVE DIAGNOSIS:  IUP34WKS; IUGR; Primary C-Section Female At 2338; Apgar 7/9  PROCEDURE:  Procedure(s): CESAREAN SECTION  SURGEON:  Surgeon(s): [facility]. Najae Rathert  PHYSICIAN ASSISTANT:   ASSISTANTS: none   ANESTHESIA:   spinal  ESTIMATED BLOOD LOSS: * No blood loss amount entered *   BLOOD ADMINISTERED:none  DRAINS: Urinary Catheter (Foley)   LOCAL MEDICATIONS USED:  NONE  SPECIMEN:  Source of Specimen:  placenta  DISPOSITION OF SPECIMEN:  PATHOLOGY  COUNTS:  YES  TOURNIQUET:  * No tourniquets in log *    PLAN OF CARE: routine post-op, baby to NICU  PATIENT DISPOSITION:  PACU - hemodynamically stable.   Delay start of Pharmacological VTE agent (>24hrs) due to surgical blood loss or risk of bleeding:  yes              01/17/2011 - 01/18/2011  12:13 AM  PATIENT:  [name] [name]  35 y.o. female  PRE-OPERATIVE DIAGNOSIS:  Intrauterine Pregnancy At 45  Weeks; Severe Intrauterine Growth Retardation; Fetal Heart Decelarations  POST-OPERATIVE DIAGNOSIS:  IUP34WKS; IUGR; Primary C-Section Female At 2338; Apgar 7/9  PROCEDURE:  Procedure(s): CESAREAN SECTION  SURGEON:  Surgeon(s): [facility]. Indra Wolters

## 2011-01-18 NOTE — Transfer of Care (Signed)
Immediate Anesthesia Transfer of Care Note  Patient: [name] [name]  Procedure(s) Performed:  CESAREAN SECTION  Patient Location: PACU  Anesthesia Type: Regional and Spinal  Level of Consciousness: awake, alert  and oriented  Airway & Oxygen Therapy: Patient Spontanous Breathing  Post-op Assessment: Report given to PACU RN and Post -op Vital signs reviewed and stable  Post vital signs: Reviewed and stable  Complications: No apparent anesthesia complications

## 2011-01-19 ENCOUNTER — Encounter (HOSPITAL_COMMUNITY): Payer: Self-pay

## 2011-01-19 DIAGNOSIS — D696 Thrombocytopenia, unspecified: Secondary | ICD-10-CM | POA: Diagnosis present

## 2011-01-19 HISTORY — DX: Thrombocytopenia, unspecified: D69.6

## 2011-01-19 NOTE — Progress Notes (Addendum)
  Subjective: POD# 2 Information for the patient's newborn:  [name], [name] [[id number]]  female NICU admit  Reports feeling well, coping with infant in NICU Feeding: breast / pumping Patient reports tolerating PO.  Breast symptoms: filling Pain controlled withnarcotic analgesics including Percocet Denies HA/SOB/C/P/N/V/dizziness. Flatus  present. She reports vaginal bleeding as normal, without clots.  She is ambulating, urinating without difficult.     Objective:   VS: Blood pressure 107/62, pulse 61, temperature 98.3 F (36.8 C), temperature source Oral, resp. rate 18, height 5\' 2"  (1.575 m), weight 89.812 kg (198 lb), last menstrual period 05/18/2010, SpO2 99.00%, unknown if currently breastfeeding.   Intake/Output Summary (Last 24 hours) at 01/19/11 1106 Last data filed at 01/18/11 1800  Gross per 24 hour  Intake 1318.75 ml  Output   2600 ml  Net -1281.25 ml        Basename 01/18/11 0640 01/17/11 2115  WBC 12.6* 11.2*  HGB 10.9* 12.1  HCT 32.9* 35.9*  PLT 91* 108*     Blood type: B POS, B POS (08/02 2208)  Rubella: Immune (08/02 0000)     Physical Exam:  General: alert, cooperative and no distress CV: Regular rate and rhythm Resp: clear Abdomen: soft, nontender, normal bowel sounds Incision: staples to LST, clean, dry, intact and no drainage present Uterine Fundus: firm, below umbilicus, nontender Lochia: minimal Ext: Homans sign is negative, no sign of DVT and trace edema      Assessment/Plan: 35 y.o.  status post Cesarean section. POD# 2.  s/p Cesarean Delivery.  Indications: IUGR and reverse EDF, NRFHT       Gestational thrombocytopenia - no s/s hemorrhage, off NSAIDs, plan rpt plts in am          Stable post-op Coping well with unplanned 1 C/S and NICU infant             Routine post-op care Anticipate discharge in am  [name] [name] 01/19/2011, 11:06 AM

## 2011-01-19 NOTE — Progress Notes (Signed)
Mom pumping upon entering room.  Increased flange size to #30 on left and #36 on right d/t nipple rubbing on smaller flanges.  Mom obtained 3 ml.of colostrum after 30 minutes pumping.    Encouraged good massage before, during, and at end of pumping to increase milk production.  Pumping q3hrs.  Encouraged to pump every 2-3 during the day and once during the night for minimum of 8x/24hrs.  Reviewed engorgement prevention.  Thinking about renting pump.  Pump packet left at bedside with verbal instruction for completing needed rental information.  Mom has been pumping on standard setting.  Explained the preemie setting for use if she wants while here in hospital.  Encouraged to call for assistance.

## 2011-01-20 MED ORDER — IBUPROFEN 800 MG PO TABS: 800.0000 mg | ORAL_TABLET | Freq: Three times a day (TID) | ORAL | Status: AC | PRN

## 2011-01-20 MED ORDER — OXYCODONE-ACETAMINOPHEN 5-325 MG PO TABS: 1.0000 | ORAL_TABLET | ORAL | Status: AC | PRN

## 2011-01-20 NOTE — Discharge Summary (Signed)
Patient ID: [name] [name] MRN: [id number] DOB/AGE: [birth year] 35 y.o.  Admit date: 01/17/2011 Discharge date:  01/20/2011  Admission Diagnoses: IUP at 34 weeks 1 day  IUGR with reversed EDF, decreased AFI Non-reassuring fetal heart tracing Unfavorable cervix  Discharge Diagnoses:  Principal Problem:  *Postpartum care following cesarean delivery Active Problems:  Postpartum care and examination of lactating mother  Gestational thrombocytopenia without hemorrhage   Prenatal history:  Gravida 4 para1021 at 85 1/[redacted] weeks gestation with Estimated Date of Delivery: 02/27/11 Prenatal care at [facility] since [redacted] weeks gestation with Dr. Billy Coast as primary provider.   Prenatal course complicated by marginal cord insertion, IUGR  Prenatal Labs: ABO, Rh: B POS, B POS (08/02 2208) Antibody: NEG (08/02 2208) Rubella:  immune RPR: NON REACTIVE (08/02 2115)  HBsAg: Negative (08/02 0000)  HIV: Non-reactive (08/02 0000)  GBS: Unknown (08/02 0000)  3 hr Glucola : wnl   Medical / Surgical History : Past Medical History  Diagnosis Date  . Postpartum care following cesarean delivery 01/18/2011  . Gestational thrombocytopenia without hemorrhage 01/19/2011   History reviewed. No pertinent past surgical history. Social History:  reports that she has been smoking Cigarettes.  She has a 2.5 pack-year smoking history. She has never used smokeless tobacco. She reports that she does not drink alcohol or use illicit drugs.  Allergies: Review of patient's allergies indicates no known allergies.   Current Medications at time of admission:  Prescriptions prior to admission  Medication Sig Dispense Refill  . Prenatal Vitamins (DIS) TABS Take 1 tablet by mouth daily.          Intrapartum course: IUGR since 27 wks, now w/ new drops in AFI along with absent and reversed EDF. Plan to move to delivery when weighing risks of continued expectant management with risks of prematurity at 34+  weeks. Pt has also refused inpt monitoring in past and risk of pt non-compliance with monitoring worrisome. Dr. Tona Sensing in agreement with decision for delivery.  - MOD. D/w pt option for CST and IOL if passes CST vs PCS. As she has already had decels with rare ctx, had decel with position change and audible stops in FH during NST, along with unripe cvx we have made the decision to proceed with PCS. We discussed her favorable factor of prior vaginal delivery, though this was 12 yrs ago.       - Ancef prophylaxis, GBS unknown    Procedures: Cesarean section delivery of female newborn by Dr Ernestina Penna  See operative report for further details  Postoperative / postpartum course: Gestational thrombocytopenia, improving platelets on POD #3  Physical Exam:  VSS: Blood pressure 114/76, pulse 74, temperature 98 F (36.7 C), temperature source Oral, resp. rate 18, height 5\' 2"  (1.575 m), weight 89.812 kg (198 lb), last menstrual period 05/18/2010, SpO2 99.00%,   LABS:  Lab Results  Component Value Date   HGB 10.9* 01/18/2011                             Lab Results  Component Value Date   PLT 108* 01/20/2011     Incision:  approximated with staples / no erythema / no ecchymosis / no drainage Staples: removed prior to discharge  Discharge Instructions:  Postpartum Instructions: per [name] Booklet - given to patient  Discharged Condition: good  Diet: Regular diet with adequate water hydration.  Discharge Orders    Future Orders Please Complete By Expires  Diet - low sodium heart healthy      Discharge instructions      Comments:   WOB instructions booklet    Strep B DNA probe      Comments:   This external order was created through the Results Console.   CBC      Comments:   This external order was created through the Results Console.   HIV antibody      Comments:   This external order was created through the Results Console.   Rubella antibody, IgM      Comments:    This external order was created through the Results Console.   Hepatitis B surface antigen      Comments:   This external order was created through the Results Console.   RPR      Comments:   This external order was created through the Results Console.   RPR      Comments:   This external order was created through the Results Console.   HIV antibody      Comments:   This external order was created through the Results Console.   RPR      Comments:   This external order was created through the Results Console.   Type and screen      Comments:   This external order was created through the Results Console.   ABO/Rh      Comments:   This external order was created through the Results Console.     Current Discharge Medication List    START taking these medications   Details  ibuprofen (ADVIL,MOTRIN) 800 MG tablet Take 1 tablet (800 mg total) by mouth every 8 (eight) hours as needed for pain. Qty: 60 tablet, Refills: 0    oxyCODONE-acetaminophen (PERCOCET) 5-325 MG per tablet Take 1-2 tablets by mouth every 3 (three) hours as needed (moderate - severe pain). Qty: 30 tablet, Refills: 0      CONTINUE these medications which have NOT CHANGED   Details  Prenatal Vitamins (DIS) TABS Take 1 tablet by mouth daily.         Follow-up Information    Follow up with Lenoard Aden, MD.   Contact information:   [address] [place] [address] [phone]          Disposition: Final discharge disposition not confirmed  PAUL,DANIELA 01/20/2011, 9:12 AM  Reviewed, agree

## 2011-01-20 NOTE — Consult Note (Signed)
Has lansinoh at home.  Encouraged to use symphony in NICU and to rent hospital grade pump if lansinoh does not empty as well.  Expressing about 20-30 ml.  Follow up with LC prn.

## 2011-01-20 NOTE — Progress Notes (Signed)
Subjective: POD# 3 Information for the patient's newborn:  Chalene, Treu [[id number]]  female infant in NICU, stable  Reports feeling well Feeding: breast/pumping Patient reports tolerating PO.  Breast symptoms:milk coming in. Pain controlled withnarcotic analgesics including percocet Denies HA/SOB/C/P/N/V/dizziness. Flatus present. She reports vaginal bleeding as normal, without clots.  She is ambulating, urinating without difficult.     Objective:   VS: Blood pressure 114/76, pulse 74, temperature 98 F (36.7 C), temperature source Oral, resp. rate 18, height 5\' 2"  (1.575 m), weight 89.812 kg (198 lb), last menstrual period 05/18/2010, SpO2 99.00%,  No intake or output data in the 24 hours ending 01/20/11 0902      Basename 01/20/11 0500 01/18/11 0640 01/17/11 2115  WBC -- 12.6* 11.2*  HGB -- 10.9* 12.1  HCT -- 32.9* 35.9*  PLT 108* 91* --     Blood type: B POS,  Rubella: Immune (08/02 0000)     Physical Exam:  General: alert, cooperative and no distress CV: Regular rate and rhythm Resp: clear Abdomen: soft, nontender, normal bowel sounds Incision: staples clean, dry, intact, erythematous and no drainage present Uterine Fundus: firm, below umbilicus, nontender Lochia: minimal Ext: Homans sign is negative, no sign of DVT   Assessment/Plan: 35 y.o.  status post Cesarean section. POD# 3.  s/p Cesarean Delivery.  Indications: fetal distress                Principal Problem:  *Postpartum care following cesarean delivery Active Problems:  Postpartum care and examination of lactating mother  Gestational thrombocytopenia without hemorrhage  Stable/improving platelet count, discussed no NSAIDS for next 3-4 days, then may resume Doing well, stable post-op.    Remove staples today         Plan D/C home today, infant remains in NICU - stable  PAUL,DANIELA 01/20/2011, 9:02 AM

## 2011-01-21 ENCOUNTER — Other Ambulatory Visit (HOSPITAL_COMMUNITY): Payer: BC Managed Care – PPO

## 2011-01-22 NOTE — Progress Notes (Signed)
UR Chart review completed.  

## 2011-01-23 ENCOUNTER — Other Ambulatory Visit (HOSPITAL_COMMUNITY): Payer: BC Managed Care – PPO

## 2011-01-23 ENCOUNTER — Ambulatory Visit (HOSPITAL_COMMUNITY)
Admission: RE | Admit: 2011-01-23 | Discharge: 2011-01-23 | Disposition: A | Discharge status: Home / Self Care | Payer: BC Managed Care – PPO | Source: Ambulatory Visit | Attending: Obstetrics | Admitting: Obstetrics

## 2011-01-29 ENCOUNTER — Ambulatory Visit (HOSPITAL_COMMUNITY): Payer: BC Managed Care – PPO

## 2011-03-13 ENCOUNTER — Encounter (HOSPITAL_COMMUNITY): Payer: Self-pay | Admitting: Obstetrics

## 2012-09-11 IMAGING — US US OB LIMITED
1 series · 14 of 28 positions shown · non-contrast
Comparison: none

[Series 1: us ob limited · 31 acquisitions, 14 frames shown]
[im 2/31]
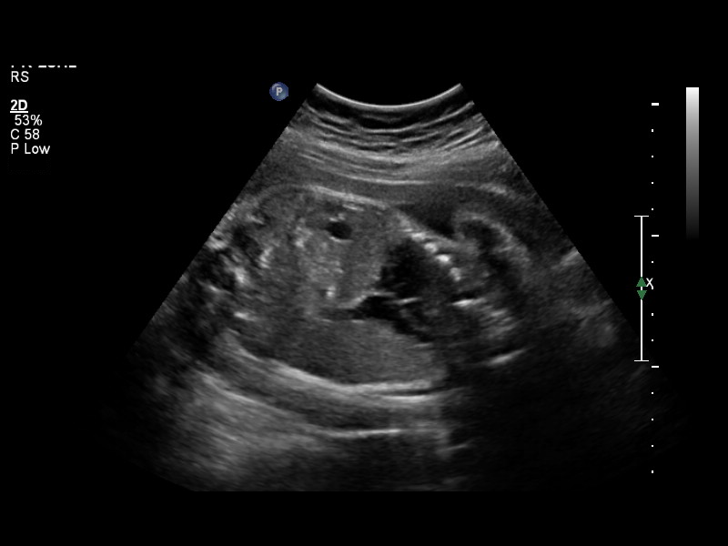
[im 4/31]
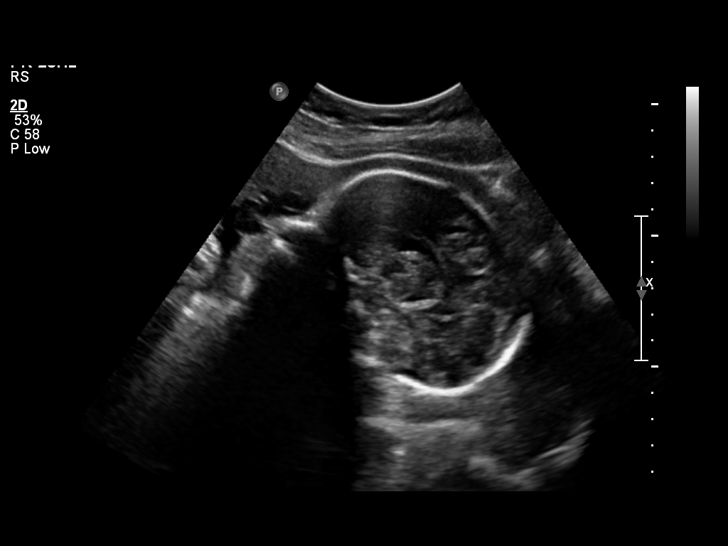
[im 6/31]
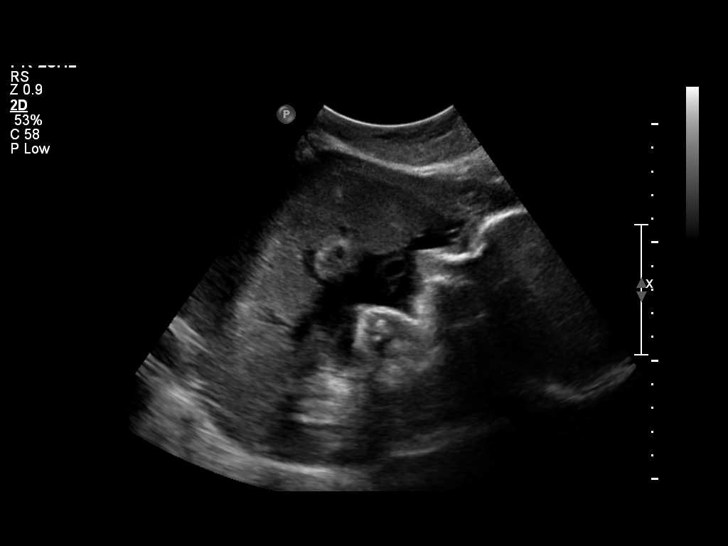
[im 8/31]
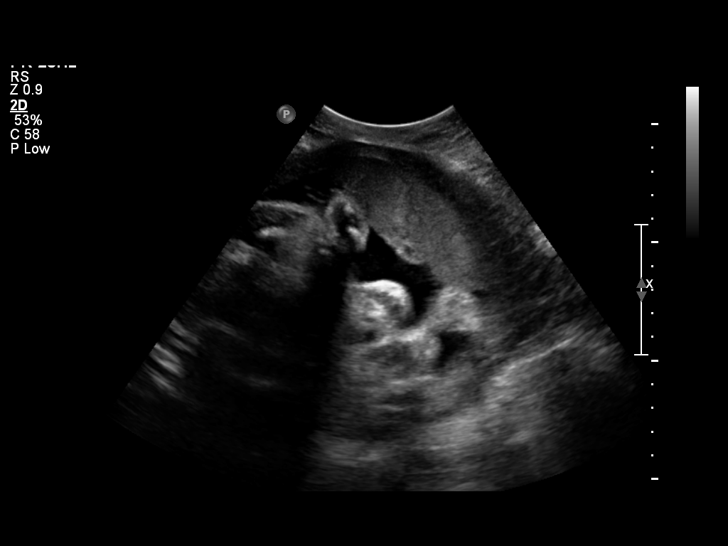
[im 11/31]
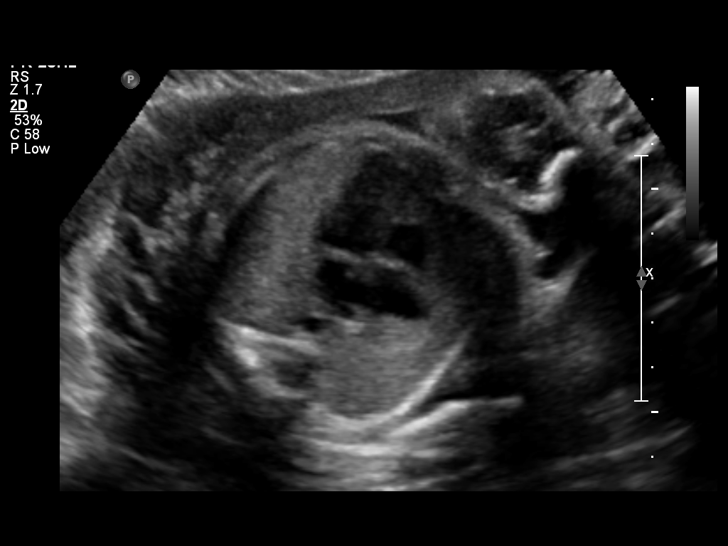
[im 13/31]
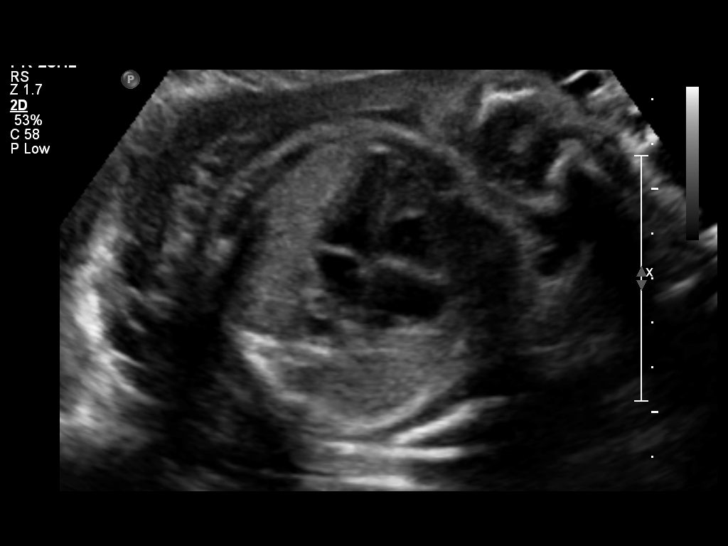
[im 15/31]
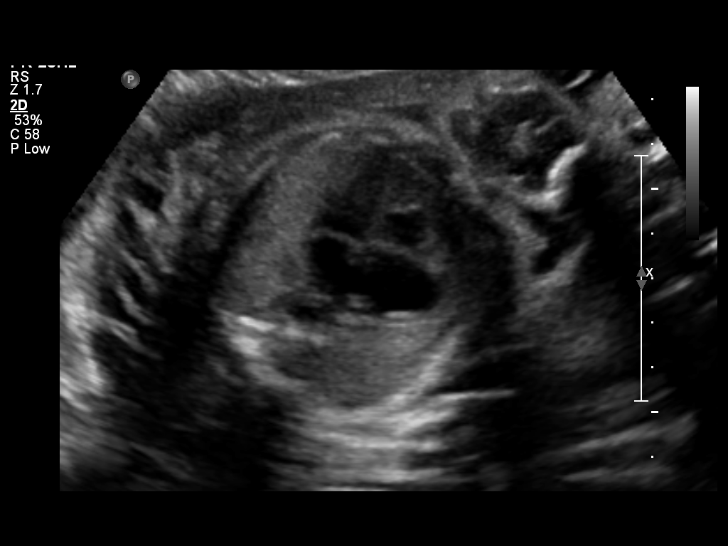
[im 17/31]
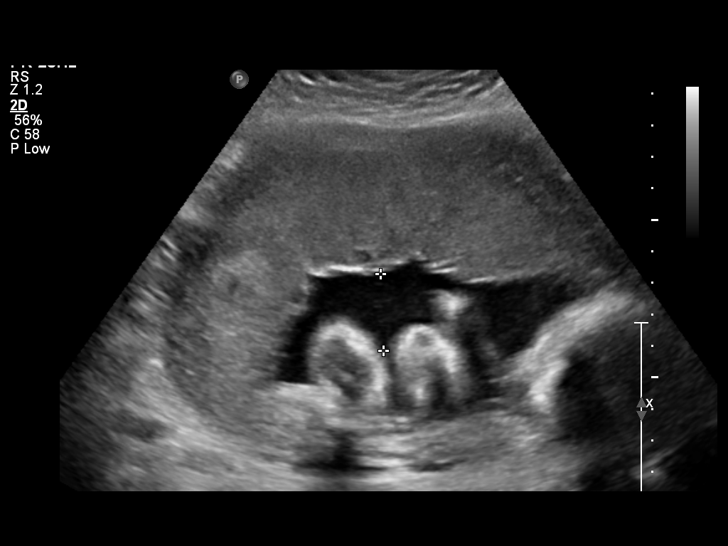
[im 19/31]
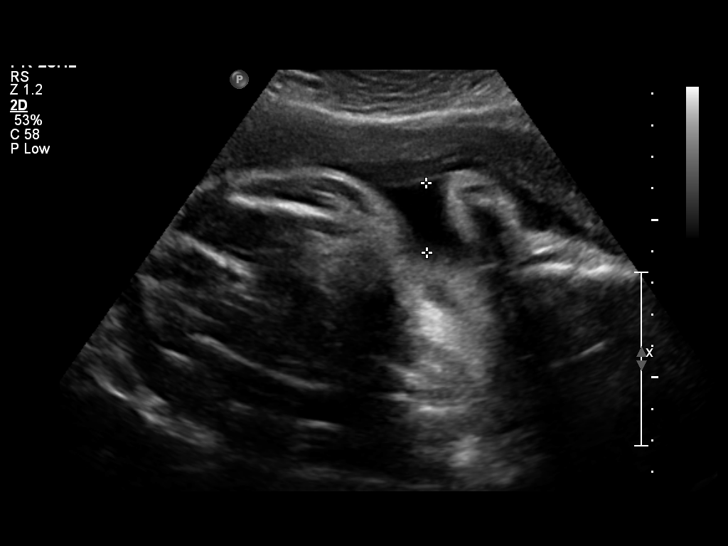
[im 22/31]
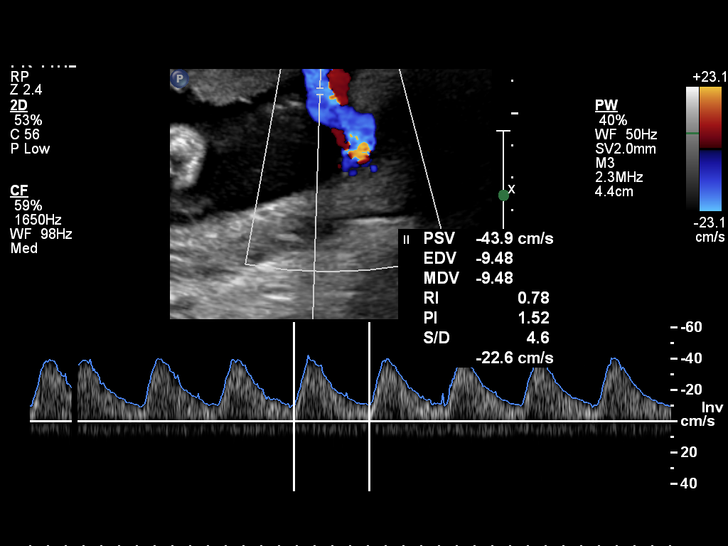
[im 24/31]
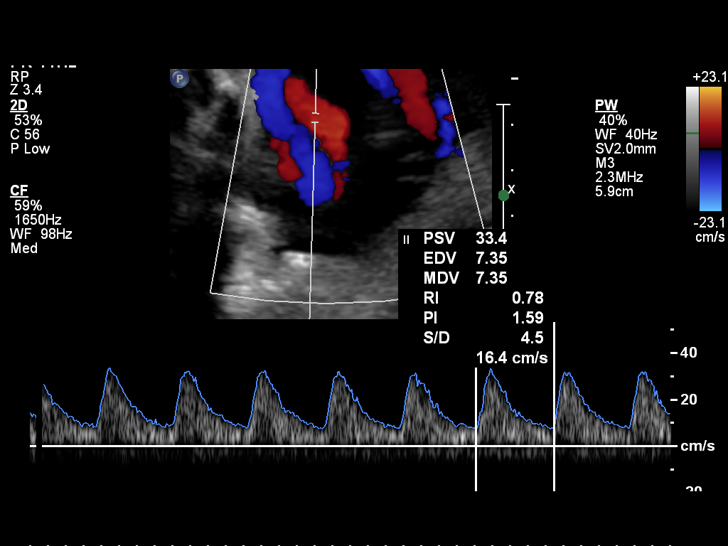
[im 26/31]
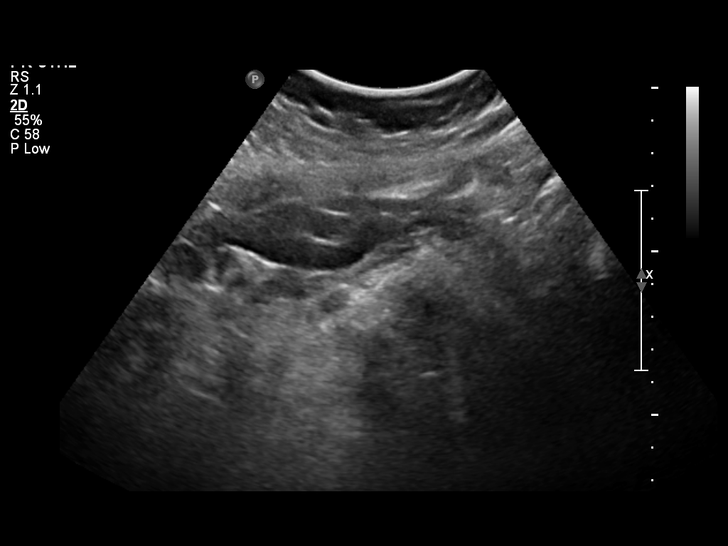
[im 28/31]
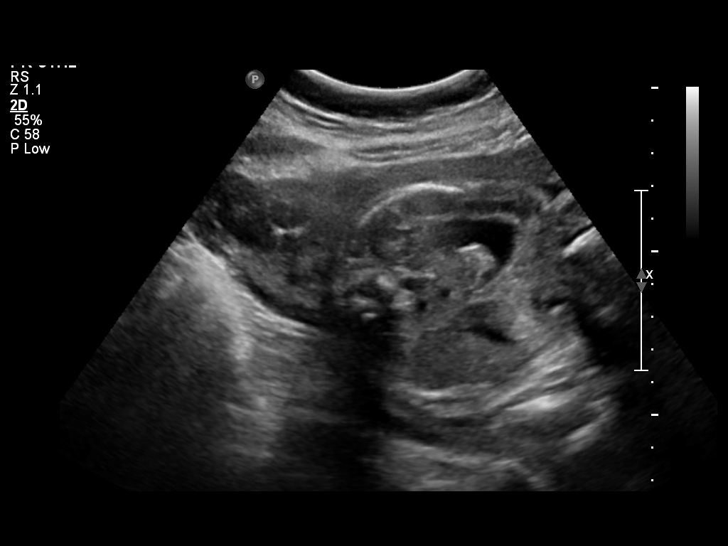
[im 31/31]
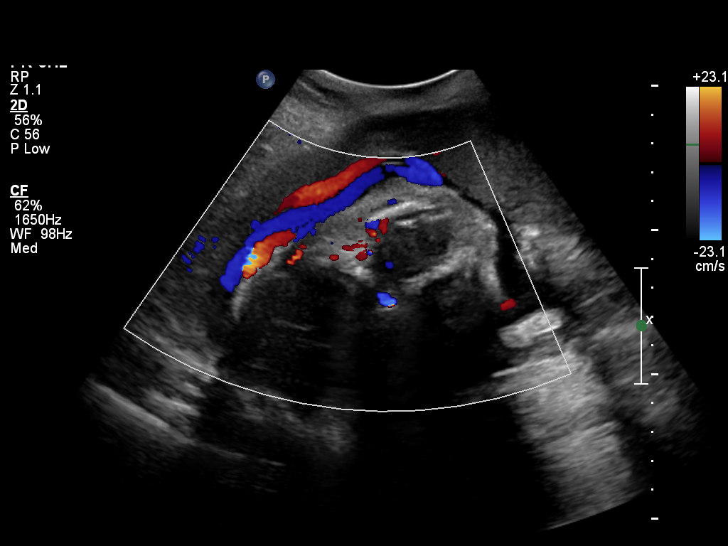

[14 of 28 positions shown; findings below may reference images not displayed]

Canned report from images found in remote index.

Refer to host system for actual result text.

## 2012-11-19 ENCOUNTER — Encounter: Payer: Self-pay | Admitting: Internal Medicine

## 2012-11-19 ENCOUNTER — Ambulatory Visit (INDEPENDENT_AMBULATORY_CARE_PROVIDER_SITE_OTHER): Payer: BC Managed Care – PPO | Admitting: Internal Medicine

## 2012-11-19 VITALS — BP 124/74 | HR 63 | Temp 98.8°F | Ht 63.5 in | Wt 182.0 lb

## 2012-11-19 DIAGNOSIS — R911 Solitary pulmonary nodule: Secondary | ICD-10-CM

## 2012-11-19 NOTE — Progress Notes (Signed)
  Subjective:    Patient ID: [name] [name], female    DOB: [birth year]   MRN: [id number]  HPI  73 yobf smokes for flavor testing only with incidental spn at w/u for donor status.  11/19/2012 1st pulmonary eval cc incidental RLL SPN x 3 mm on CT 07/09/12 being considered for renal transplant center.  occ colds,  Spring allergies with sneezing controlled on Zyrtec, no unusual exp hx  Sleeping ok without nocturnal  or early am exacerbation  of respiratory  C/o's a. Also denies any obvious fluctuation of symptoms with weather or environmental changes or other aggravating or alleviating factors except as outlined above.    Review of Systems  Constitutional: Negative for fever, chills and unexpected weight change.  HENT: Negative for ear pain, nosebleeds, congestion, sore throat, rhinorrhea, sneezing, trouble swallowing, dental problem, voice change, postnasal drip and sinus pressure.   Eyes: Negative for visual disturbance.  Respiratory: Negative for cough, choking and shortness of breath.   Cardiovascular: Negative for chest pain and leg swelling.  Gastrointestinal: Negative for vomiting, abdominal pain and diarrhea.  Genitourinary: Negative for difficulty urinating.  Musculoskeletal: Negative for arthralgias.  Skin: Negative for rash.  Neurological: Positive for headaches. Negative for tremors and syncope.  Hematological: Does not bruise/bleed easily.       Objective:   Physical Exam Wt Readings from Last 3 Encounters:  11/19/12 182 lb (82.555 kg)  01/17/11 198 lb (89.812 kg)  01/17/11 198 lb (89.812 kg)    HEENT: nl dentition, turbinates, and orophanx. Nl external ear canals without cough reflex   NECK :  without JVD/Nodes/TM/ nl carotid upstrokes bilaterally   LUNGS: no acc muscle use, clear to A and P bilaterally without cough on insp or exp maneuvers   CV:  RRR  no s3 or murmur or increase in P2, no edema   ABD:  soft and nontender with nl excursion in the supine  position. No bruits or organomegaly, bowel sounds nl  MS:  warm without deformities, calf tenderness, cyanosis or clubbing  SKIN: warm and dry without lesions    NEURO:  alert, approp, no deficits   CT abd 07/09/12 spn 3 mm RLL      Assessment & Plan:

## 2012-11-19 NOTE — Patient Instructions (Addendum)
The nodule in the RLL is 3 mm and your risk is low but not zero so I recommend a repeat CT of Chest in 06/2013 (will place your chart in a tickle file for recall)   There is no contraindication to donating a kidney from a pulmonary perspective related to this nodule.

## 2012-11-20 DIAGNOSIS — R911 Solitary pulmonary nodule: Secondary | ICD-10-CM | POA: Insufficient documentation

## 2012-11-20 NOTE — Assessment & Plan Note (Signed)
See CT Chest report from 07/09/12  RLL 3 mm  Her risk is minimal because she only "smokes" to test flavors - but her risk isn't "zero" so reaonsable to repeat limited CT in one year  No contraindication to donating kidney or evidence of granulomatous/ infection process that would affect the donated kidney in any way  Discussed in detail all the  indications, usual  risks and alternatives  relative to the benefits with patient who agrees to proceed with conservative f/u in Jan 2014

## 2013-04-25 ENCOUNTER — Ambulatory Visit (INDEPENDENT_AMBULATORY_CARE_PROVIDER_SITE_OTHER): Payer: BC Managed Care – PPO | Admitting: Physician Assistant

## 2013-04-25 VITALS — BP 120/78 | HR 57 | Temp 98.0°F | Resp 16 | Ht 62.5 in | Wt 183.0 lb

## 2013-04-25 DIAGNOSIS — S61209A Unspecified open wound of unspecified finger without damage to nail, initial encounter: Secondary | ICD-10-CM

## 2013-04-25 DIAGNOSIS — M79642 Pain in left hand: Secondary | ICD-10-CM

## 2013-04-25 DIAGNOSIS — M25549 Pain in joints of unspecified hand: Secondary | ICD-10-CM

## 2013-04-25 NOTE — Progress Notes (Signed)
  Subjective:    Patient ID: [name] [name], female    DOB: October 04, 1975, 37 y.o.   MRN: [id number]  Laceration    37 year old female presents for evaluation of a laceration to her left thumb. Injury occurred last night at 8:00 p.m while cooking - cut with a knife. States it did not seem bad at first and did not hurt much. As the night progressed it did continue to bleed. This morning she now complains of pain in her thumb and also pain through her entire hand including her 4th and 5th fingers. She is able to move all fingers but admits to pain with ROM.  No numbness or weakness.  Last tetanus was 2012.      Review of Systems  Musculoskeletal: Negative for joint swelling.  Skin: Positive for wound.  Neurological: Negative for weakness and numbness.       Objective:   Physical Exam  Constitutional: She is oriented to person, place, and time. She appears well-developed and well-nourished.  HENT:  Head: Normocephalic and atraumatic.  Right Ear: External ear normal.  Left Ear: External ear normal.  Eyes: Conjunctivae are normal.  Neck: Normal range of motion.  Cardiovascular: Normal rate.   Pulmonary/Chest: Effort normal.  Neurological: She is alert and oriented to person, place, and time.  Skin:  There is a 2 cm superficial laceration to pad of left thumb. 5/5 strength, sensation intact, normal ROM.  Patient has diffuse pain to palpation throughout thumb and hand.  No significant bony tenderness.    Psychiatric: She has a normal mood and affect. Her behavior is normal. Judgment and thought content normal.          Assessment & Plan:  Pain, hand joint, left  Wound, open, finger, initial encounter  Placed in finger splint for protection.  Wound edges approximate well and wound is not actively bleeding today. No repair necessary at this time.  Motrin or tylenol for pain. Follow up if symptoms worsen or fail to improve See patient instructions

## 2013-04-25 NOTE — Patient Instructions (Signed)
Keep wound clean and dry for at least 48 hours.  In 2 days, take splint off and check wound. If pain has improved and area has scabbed over, ok to wash with soap and water Continue to wear splint during activity for 4-5 days to prevent disruption of wound

## 2013-06-17 NOTE — L&D Delivery Note (Signed)
Delivery Note At 6:43 AM a viable and healthy female was delivered via  (Presentation: LOA  ).  APGAR: 8, 9; weight  pending.   Placenta status: spontaneous .intact  Cord:  with the following complications: none.  Cord pH: na  Anesthesia:  local Episiotomy:  none Lacerations:  Left vaginal Suture Repair: 2.0 vicryl rapide Est. Blood Loss (mL):  200  Mom to postpartum.  Baby to Couplet care / Skin to Skin.  Nicolaas Savo J 05/05/2014, 6:54 AM

## 2013-07-09 ENCOUNTER — Telehealth: Payer: Self-pay | Admitting: *Deleted

## 2013-07-09 NOTE — Telephone Encounter (Signed)
LMTCB

## 2013-07-09 NOTE — Telephone Encounter (Signed)
Message copied by Christen ButterASKIN, Saje Gallop M on Fri Jul 09, 2013  2:18 PM ------      Message from: Sandrea HughsWERT, MICHAEL B      Created: Fri Nov 20, 2012  6:37 AM       F/uw limited cT no contrast due if not done yet ------

## 2013-07-13 NOTE — Telephone Encounter (Signed)
LMTCB

## 2013-07-23 ENCOUNTER — Encounter: Payer: Self-pay | Admitting: *Deleted

## 2013-07-23 NOTE — Telephone Encounter (Signed)
ATC, Still unable to reach the pt  Letter mailed to the pt

## 2013-10-08 LAB — OB RESULTS CONSOLE GC/CHLAMYDIA
Chlamydia: NEGATIVE
Gonorrhea: NEGATIVE

## 2013-10-09 LAB — OB RESULTS CONSOLE ABO/RH: RH Type: POSITIVE

## 2013-10-09 LAB — OB RESULTS CONSOLE HEPATITIS B SURFACE ANTIGEN: Hepatitis B Surface Ag: NEGATIVE

## 2013-10-09 LAB — OB RESULTS CONSOLE RPR: RPR: NONREACTIVE

## 2013-10-09 LAB — OB RESULTS CONSOLE ANTIBODY SCREEN: Antibody Screen: NEGATIVE

## 2013-10-09 LAB — OB RESULTS CONSOLE HIV ANTIBODY (ROUTINE TESTING): HIV: NONREACTIVE

## 2013-10-09 LAB — OB RESULTS CONSOLE RUBELLA ANTIBODY, IGM: Rubella: IMMUNE

## 2014-05-05 ENCOUNTER — Inpatient Hospital Stay (HOSPITAL_COMMUNITY)
Admission: AD | Admit: 2014-05-05 | Discharge: 2014-05-07 | DRG: 775 | Disposition: A | Discharge status: Home / Self Care | Payer: BC Managed Care – PPO | Source: Ambulatory Visit | Attending: Obstetrics and Gynecology | Admitting: Obstetrics and Gynecology

## 2014-05-05 ENCOUNTER — Encounter (HOSPITAL_COMMUNITY): Payer: Self-pay

## 2014-05-05 DIAGNOSIS — O471 False labor at or after 37 completed weeks of gestation: Secondary | ICD-10-CM | POA: Diagnosis present

## 2014-05-05 DIAGNOSIS — D62 Acute posthemorrhagic anemia: Secondary | ICD-10-CM | POA: Diagnosis present

## 2014-05-05 DIAGNOSIS — O99334 Smoking (tobacco) complicating childbirth: Secondary | ICD-10-CM | POA: Diagnosis present

## 2014-05-05 DIAGNOSIS — O9902 Anemia complicating childbirth: Secondary | ICD-10-CM | POA: Diagnosis present

## 2014-05-05 DIAGNOSIS — F17210 Nicotine dependence, cigarettes, uncomplicated: Secondary | ICD-10-CM | POA: Diagnosis present

## 2014-05-05 DIAGNOSIS — D696 Thrombocytopenia, unspecified: Secondary | ICD-10-CM | POA: Diagnosis present

## 2014-05-05 DIAGNOSIS — O9912 Other diseases of the blood and blood-forming organs and certain disorders involving the immune mechanism complicating childbirth: Secondary | ICD-10-CM | POA: Diagnosis present

## 2014-05-05 DIAGNOSIS — D6959 Other secondary thrombocytopenia: Secondary | ICD-10-CM | POA: Diagnosis present

## 2014-05-05 DIAGNOSIS — Z3A38 38 weeks gestation of pregnancy: Secondary | ICD-10-CM | POA: Diagnosis present

## 2014-05-05 DIAGNOSIS — O99119 Other diseases of the blood and blood-forming organs and certain disorders involving the immune mechanism complicating pregnancy, unspecified trimester: Secondary | ICD-10-CM

## 2014-05-05 DIAGNOSIS — O34219 Maternal care for unspecified type scar from previous cesarean delivery: Secondary | ICD-10-CM | POA: Diagnosis not present

## 2014-05-05 DIAGNOSIS — O3421 Maternal care for scar from previous cesarean delivery: Secondary | ICD-10-CM | POA: Diagnosis present

## 2014-05-05 DIAGNOSIS — O09523 Supervision of elderly multigravida, third trimester: Secondary | ICD-10-CM | POA: Diagnosis not present

## 2014-05-05 HISTORY — DX: Acute posthemorrhagic anemia: D62

## 2014-05-05 LAB — TYPE AND SCREEN
ABO/RH(D): B POS
Antibody Screen: NEGATIVE

## 2014-05-05 LAB — CBC
HCT: 36.1 % (ref 36.0–46.0)
Hemoglobin: 12.4 g/dL (ref 12.0–15.0)
MCH: 28.1 pg (ref 26.0–34.0)
MCHC: 34.3 g/dL (ref 30.0–36.0)
MCV: 81.9 fL (ref 78.0–100.0)
PLATELETS: 72 10*3/uL — AB (ref 150–400)
RBC: 4.41 MIL/uL (ref 3.87–5.11)
RDW: 15 % (ref 11.5–15.5)
WBC: 13.9 10*3/uL — ABNORMAL HIGH (ref 4.0–10.5)

## 2014-05-05 LAB — RPR

## 2014-05-05 LAB — OB RESULTS CONSOLE GBS: STREP GROUP B AG: NEGATIVE

## 2014-05-05 MED ORDER — DIPHENHYDRAMINE HCL 25 MG PO CAPS: 25.0000 mg | ORAL_CAPSULE | Freq: Four times a day (QID) | ORAL | Status: DC | PRN

## 2014-05-05 MED ORDER — SIMETHICONE 80 MG PO CHEW: 80.0000 mg | CHEWABLE_TABLET | ORAL | Status: DC | PRN

## 2014-05-05 MED ORDER — PRENATAL MULTIVITAMIN CH
1.0000 | ORAL_TABLET | Freq: Every day | ORAL | Status: DC
  Administered 2014-05-05 – 2014-05-07 (×3): 1 via ORAL
  Filled 2014-05-05 (×3): qty 1

## 2014-05-05 MED ORDER — METHYLERGONOVINE MALEATE 0.2 MG/ML IJ SOLN: 0.2000 mg | INTRAMUSCULAR | Status: DC | PRN

## 2014-05-05 MED ORDER — PHENYLEPHRINE 40 MCG/ML (10ML) SYRINGE FOR IV PUSH (FOR BLOOD PRESSURE SUPPORT)
80.0000 ug | PREFILLED_SYRINGE | INTRAVENOUS | Status: DC | PRN
  Filled 2014-05-05: qty 2

## 2014-05-05 MED ORDER — BENZOCAINE-MENTHOL 20-0.5 % EX AERO
1.0000 "application " | INHALATION_SPRAY | CUTANEOUS | Status: DC | PRN
  Filled 2014-05-05: qty 56

## 2014-05-05 MED ORDER — FENTANYL 2.5 MCG/ML BUPIVACAINE 1/10 % EPIDURAL INFUSION (WH - ANES)
14.0000 mL/h | INTRAMUSCULAR | Status: DC | PRN
  Filled 2014-05-05: qty 125

## 2014-05-05 MED ORDER — TETANUS-DIPHTH-ACELL PERTUSSIS 5-2.5-18.5 LF-MCG/0.5 IM SUSP: 0.5000 mL | Freq: Once | INTRAMUSCULAR | Status: DC

## 2014-05-05 MED ORDER — PHENYLEPHRINE 40 MCG/ML (10ML) SYRINGE FOR IV PUSH (FOR BLOOD PRESSURE SUPPORT)
80.0000 ug | PREFILLED_SYRINGE | INTRAVENOUS | Status: DC | PRN
  Filled 2014-05-05: qty 2
  Filled 2014-05-05: qty 10

## 2014-05-05 MED ORDER — ONDANSETRON HCL 4 MG PO TABS: 4.0000 mg | ORAL_TABLET | ORAL | Status: DC | PRN

## 2014-05-05 MED ORDER — EPHEDRINE 5 MG/ML INJ
10.0000 mg | INTRAVENOUS | Status: DC | PRN
  Filled 2014-05-05: qty 2

## 2014-05-05 MED ORDER — LIDOCAINE HCL (PF) 1 % IJ SOLN
30.0000 mL | INTRAMUSCULAR | Status: DC | PRN
Start: 2014-05-05 — End: 2014-05-05
  Administered 2014-05-05: 30 mL via SUBCUTANEOUS
  Filled 2014-05-05: qty 30

## 2014-05-05 MED ORDER — OXYCODONE-ACETAMINOPHEN 5-325 MG PO TABS: 1.0000 | ORAL_TABLET | ORAL | Status: DC | PRN

## 2014-05-05 MED ORDER — FLEET ENEMA 7-19 GM/118ML RE ENEM: 1.0000 | ENEMA | RECTAL | Status: DC | PRN

## 2014-05-05 MED ORDER — ACETAMINOPHEN 325 MG PO TABS: 650.0000 mg | ORAL_TABLET | ORAL | Status: DC | PRN

## 2014-05-05 MED ORDER — OXYTOCIN BOLUS FROM INFUSION
500.0000 mL | INTRAVENOUS | Status: DC
  Administered 2014-05-05: 500 mL via INTRAVENOUS

## 2014-05-05 MED ORDER — LACTATED RINGERS IV SOLN
500.0000 mL | Freq: Once | INTRAVENOUS | Status: AC
  Administered 2014-05-05: 500 mL via INTRAVENOUS

## 2014-05-05 MED ORDER — DIBUCAINE 1 % RE OINT: 1.0000 "application " | TOPICAL_OINTMENT | RECTAL | Status: DC | PRN

## 2014-05-05 MED ORDER — ONDANSETRON HCL 4 MG/2ML IJ SOLN
4.0000 mg | INTRAMUSCULAR | Status: DC | PRN
Start: 2014-05-05 — End: 2014-05-07

## 2014-05-05 MED ORDER — BUPIVACAINE HCL (PF) 0.25 % IJ SOLN
INTRAMUSCULAR | Status: AC
  Filled 2014-05-05: qty 10

## 2014-05-05 MED ORDER — ZOLPIDEM TARTRATE 5 MG PO TABS: 5.0000 mg | ORAL_TABLET | Freq: Every evening | ORAL | Status: DC | PRN

## 2014-05-05 MED ORDER — OXYCODONE-ACETAMINOPHEN 5-325 MG PO TABS
1.0000 | ORAL_TABLET | ORAL | Status: DC | PRN
  Administered 2014-05-05 – 2014-05-06 (×3): 1 via ORAL
  Filled 2014-05-05 (×5): qty 1

## 2014-05-05 MED ORDER — IBUPROFEN 600 MG PO TABS: 600.0000 mg | ORAL_TABLET | Freq: Four times a day (QID) | ORAL | Status: DC

## 2014-05-05 MED ORDER — OXYCODONE-ACETAMINOPHEN 5-325 MG PO TABS: 2.0000 | ORAL_TABLET | ORAL | Status: DC | PRN

## 2014-05-05 MED ORDER — DIPHENHYDRAMINE HCL 50 MG/ML IJ SOLN: 12.5000 mg | INTRAMUSCULAR | Status: DC | PRN

## 2014-05-05 MED ORDER — SENNOSIDES-DOCUSATE SODIUM 8.6-50 MG PO TABS
2.0000 | ORAL_TABLET | ORAL | Status: DC
  Administered 2014-05-05 – 2014-05-07 (×2): 2 via ORAL
  Filled 2014-05-05 (×2): qty 2

## 2014-05-05 MED ORDER — METHYLERGONOVINE MALEATE 0.2 MG PO TABS: 0.2000 mg | ORAL_TABLET | ORAL | Status: DC | PRN

## 2014-05-05 MED ORDER — CITRIC ACID-SODIUM CITRATE 334-500 MG/5ML PO SOLN
30.0000 mL | ORAL | Status: DC | PRN
  Filled 2014-05-05 (×2): qty 15

## 2014-05-05 MED ORDER — OXYTOCIN 40 UNITS IN LACTATED RINGERS INFUSION - SIMPLE MED
62.5000 mL/h | INTRAVENOUS | Status: DC
  Filled 2014-05-05: qty 1000

## 2014-05-05 MED ORDER — BUTORPHANOL TARTRATE 1 MG/ML IJ SOLN: 1.0000 mg | Freq: Once | INTRAMUSCULAR | Status: DC

## 2014-05-05 MED ORDER — LACTATED RINGERS IV SOLN: INTRAVENOUS | Status: DC

## 2014-05-05 MED ORDER — LANOLIN HYDROUS EX OINT: TOPICAL_OINTMENT | CUTANEOUS | Status: DC | PRN

## 2014-05-05 MED ORDER — WITCH HAZEL-GLYCERIN EX PADS: 1.0000 "application " | MEDICATED_PAD | CUTANEOUS | Status: DC | PRN

## 2014-05-05 MED ORDER — ONDANSETRON HCL 4 MG/2ML IJ SOLN
4.0000 mg | Freq: Four times a day (QID) | INTRAMUSCULAR | Status: DC | PRN
  Administered 2014-05-05: 4 mg via INTRAVENOUS
  Filled 2014-05-05: qty 2

## 2014-05-05 MED ORDER — BUTORPHANOL TARTRATE 1 MG/ML IJ SOLN
INTRAMUSCULAR | Status: AC
  Filled 2014-05-05: qty 1

## 2014-05-05 MED ORDER — LACTATED RINGERS IV SOLN: 500.0000 mL | INTRAVENOUS | Status: DC | PRN

## 2014-05-05 MED ORDER — OXYCODONE-ACETAMINOPHEN 5-325 MG PO TABS
2.0000 | ORAL_TABLET | ORAL | Status: DC | PRN
  Administered 2014-05-05 – 2014-05-07 (×5): 2 via ORAL
  Filled 2014-05-05 (×4): qty 2

## 2014-05-05 NOTE — Lactation Note (Addendum)
This note was copied from the chart of Girl Read DriversChandra Steffenhagen. Lactation Consultation Note  Patient Name: Girl Read DriversChandra Volkman OZDGU'YToday's Date: 05/05/2014 Reason for consult: Initial assessment  Baby 15 hours of life. Mom reports baby is latching and nursing well. LC changed a diaper/transition stool.  Baby still had a little colostrum in mouth during diaper change from last feeding. Enc mom to keep feeding with cues. Discussed pumping at work and keeping tobacco products away from baby, including clothes she wears to work. Mom given [facility], aware of OP/BFSG, community resources, and [facility] phone [facility] services. Enc mom to call out for assistance as needed.   Maternal Data Has patient been taught Hand Expression?: Yes Does the patient have breastfeeding experience prior to this delivery?: Yes  Feeding Feeding Type: Breast Fed Length of feed: 15 min  LATCH Score/Interventions Latch: Grasps breast easily, tongue down, lips flanged, rhythmical sucking.  Audible Swallowing: A few with stimulation  Type of Nipple: Everted at rest and after stimulation  Comfort (Breast/Nipple): Soft / non-tender     Hold (Positioning): No assistance needed to correctly position infant at breast.  LATCH Score: 9  Lactation Tools Discussed/Used     Consult Status Consult Status: PRN    Geralynn OchsWILLIARD, Emmilee Reamer 05/05/2014, 10:02 PM

## 2014-05-05 NOTE — Progress Notes (Signed)
Patient ID: Read DriversChandra [name], female   DOB: 09/04/1975, 38 y.o.   MRN: [id number] INTERVAL NOTE: PPD #0   S:   Sitting in bed, BFing, min cramping, (+) voids, small bleed, denies HA/NV/dizziness  O:   VSS, AAO x 3, NAD  U Even  Minimal lochia  A / P:   PPD #0  Stable post partum  Routine PP orders  Kenard GowerAWSON, Delson Dulworth, M, MSN, CNM 05/05/2014, 4:10 PM

## 2014-05-05 NOTE — Plan of Care (Signed)
Problem: Phase II Progression Outcomes Goal: Pain controlled on oral analgesia Outcome: Completed/Met Date Met:  05/05/14 Goal: Progress activity as tolerated unless otherwise ordered Outcome: Completed/Met Date Met:  05/05/14 Goal: Afebrile, VS remain stable Outcome: Completed/Met Date Met:  05/05/14 Goal: Incision intact & without signs/symptoms of infection Outcome: Not Applicable Date Met:  94/76/54 Goal: Tolerating diet Outcome: Completed/Met Date Met:  05/05/14 Goal: Other Phase II Outcomes/Goals Outcome: Completed/Met Date Met:  05/05/14

## 2014-05-05 NOTE — H&P (Signed)
Read DriversChandra Gish is a 38 y.o. female presenting for TOLAC. Maternal Medical History:  Reason for admission: Contractions.   Contractions: Onset was less than 1 hour ago.   Frequency: regular.   Perceived severity is moderate.    Fetal activity: Perceived fetal activity is normal.    Prenatal complications: no prenatal complications Prenatal Complications - Diabetes: none.    OB History    Gravida Para Term Preterm AB TAB SAB Ectopic Multiple Living   5 2 1 1 2 1 1  0 0 2     Past Medical History  Diagnosis Date  . Postpartum care following cesarean delivery 01/18/2011  . Gestational thrombocytopenia without hemorrhage 01/19/2011   Past Surgical History  Procedure Laterality Date  . Cesarean section  01/17/2011    Procedure: CESAREAN SECTION;  Surgeon: Tresa EndoKelly A. Fogleman;  Location: [facility];  Service: Gynecology;  Laterality: N/A;   Family History: family history is not on file. Social History:  reports that she has been smoking Cigarettes.  She has been smoking about 0.00 packs per day for the past 11 years. She has never used smokeless tobacco. She reports that she does not drink alcohol or use illicit drugs.   Prenatal Transfer Tool  Maternal Diabetes: No Genetic Screening: Normal Maternal Ultrasounds/Referrals: Normal Fetal Ultrasounds or other Referrals:  None Maternal Substance Abuse:  No Significant Maternal Medications:  None Significant Maternal Lab Results:  None Other Comments:  None  Review of Systems  All other systems reviewed and are negative.   Dilation: 6 Effacement (%): 100 Station: 0 Exam by:: Filippa Yarbough MD Blood pressure 113/63. Maternal Exam:  Uterine Assessment: Contraction strength is mild.  Contraction frequency is irregular.   Abdomen: Surgical scars: low transverse.   Fetal presentation: vertex  Introitus: Normal vulva. Normal vagina.  Ferning test: negative.  Nitrazine test: negative. Amniotic fluid character: bloody.  Pelvis: adequate for  delivery.   Cervix: Cervix evaluated by digital exam.     Physical Exam  Vitals reviewed. Constitutional: She appears well-developed and well-nourished.  HENT:  Head: Normocephalic.  Cardiovascular: Normal rate and regular rhythm.   Respiratory: Effort normal and breath sounds normal.  Genitourinary: Vagina normal and uterus normal.  Musculoskeletal: Normal range of motion.    Prenatal labs: ABO, Rh: B/Positive/-- (04/25 0000) Antibody: Negative (04/25 0000) Rubella: Immune (04/25 0000) RPR: Nonreactive (04/25 0000)  HBsAg: Negative (04/25 0000)  HIV: Non-reactive (04/25 0000)  GBS: Negative (11/19 0000)   Assessment/Plan: Term IUP  For TOLAC   Rhilee Currin J 05/05/2014, 6:24 AM

## 2014-05-06 ENCOUNTER — Encounter (HOSPITAL_COMMUNITY): Payer: Self-pay | Admitting: Certified Nurse Midwife

## 2014-05-06 DIAGNOSIS — O99119 Other diseases of the blood and blood-forming organs and certain disorders involving the immune mechanism complicating pregnancy, unspecified trimester: Secondary | ICD-10-CM

## 2014-05-06 DIAGNOSIS — D696 Thrombocytopenia, unspecified: Secondary | ICD-10-CM | POA: Diagnosis present

## 2014-05-06 DIAGNOSIS — D62 Acute posthemorrhagic anemia: Secondary | ICD-10-CM

## 2014-05-06 HISTORY — DX: Acute posthemorrhagic anemia: D62

## 2014-05-06 LAB — CBC
HEMATOCRIT: 28.9 % — AB (ref 36.0–46.0)
Hemoglobin: 9.6 g/dL — ABNORMAL LOW (ref 12.0–15.0)
MCH: 27.7 pg (ref 26.0–34.0)
MCHC: 33.2 g/dL (ref 30.0–36.0)
MCV: 83.3 fL (ref 78.0–100.0)
Platelets: 69 10*3/uL — ABNORMAL LOW (ref 150–400)
RBC: 3.47 MIL/uL — ABNORMAL LOW (ref 3.87–5.11)
RDW: 15.5 % (ref 11.5–15.5)
WBC: 13.5 10*3/uL — ABNORMAL HIGH (ref 4.0–10.5)

## 2014-05-06 MED ORDER — INFLUENZA VAC SPLIT QUAD 0.5 ML IM SUSY: 0.5000 mL | PREFILLED_SYRINGE | INTRAMUSCULAR | Status: DC

## 2014-05-06 NOTE — Lactation Note (Signed)
This note was copied from the chart of Girl Read DriversChandra Troost. Lactation Consultation Note  Patient Name: Girl Read DriversChandra Ritacco ZOXWR'UToday's Date: 05/06/2014 Reason for consult: Follow-up assessment   Follow-up visit; Infant has breastfed x8 (10-3115min) in past 24 hrs + 1 attempt; voids-3 in 24 hrs/ 3 life; stools-3 in 24 hrs/ 4 life.  Mom states infant is breastfeeding well; no complaints.  Has HP from hospital but mom asked for larger flanges; #27 given and observed mom HP with #27 - appropriate fit.  Mom stated she will be getting a DEBP from insurance company and asked questions regarding obtaining pump.  Encouraged to call for questions if needed.    Consult Status Consult Status: PRN    Lendon KaVann, Treasure Ingrum Walker 05/06/2014, 7:46 PM

## 2014-05-06 NOTE — Plan of Care (Signed)
Problem: Discharge Progression Outcomes Goal: Activity appropriate for discharge plan Outcome: Completed/Met Date Met:  05/06/14 Goal: Tolerating diet Outcome: Completed/Met Date Met:  05/06/14

## 2014-05-06 NOTE — Progress Notes (Signed)
Telephone call to CNM, at patients request to discuss removal of saline lock. Update given and order given to remove NSL.

## 2014-05-06 NOTE — Progress Notes (Signed)
PPD #1- SVD  Subjective:   Reports feeling well Tolerating po/ No nausea or vomiting Bleeding is light Pain controlled with Percocet Up ad lib / ambulatory / voiding without problems Newborn: breastfeeding     Objective:   VS:  VS:  Filed Vitals:   05/05/14 0930 05/05/14 1326 05/06/14 0556 05/06/14 1005  BP: 112/67 110/50 104/77 101/64  Pulse: 60 62 55 55  Temp: 98.2 F (36.8 C) 98.3 F (36.8 C) 98.1 F (36.7 C) 98.2 F (36.8 C)  TempSrc: Oral Oral Oral Oral  Resp: 20 20  18   Height:      Weight:      SpO2:   98% 100%    LABS:  Recent Labs  05/05/14 0540 05/06/14 0540  WBC 13.9* 13.5*  HGB 12.4 9.6*  PLT 72* 69*   Blood type: --/--/B POS (11/19 0540) Rubella: Immune (04/25 0000)   I&O: Intake/Output      11/19 0701 - 11/20 0700 11/20 0701 - 11/21 0700   Blood     Total Output       Net              Physical Exam: Alert and oriented x3 Abdomen: soft, non-tender, non-distended  Fundus: firm, non-tender, U-2 Perineum: Well approximated, no significant erythema, edema, or drainage; healing well. Lochia: small Extremities: No edema, no calf pain or tenderness    Assessment:  PPD #1 G5P2122/ S/P:VBAC, vaginal laceration Gestational thrombocytopenia, delivered ABL anemia  Doing well    Plan: Continue routine post partum orders Anticipate D/C home tomorrow   Donette LarryBHAMBRI, Glendon Dunwoody, N MSN, CNM 05/06/2014, 11:11 AM

## 2014-05-07 LAB — CBC
HCT: 30.6 % — ABNORMAL LOW (ref 36.0–46.0)
Hemoglobin: 10.3 g/dL — ABNORMAL LOW (ref 12.0–15.0)
MCH: 28.1 pg (ref 26.0–34.0)
MCHC: 33.7 g/dL (ref 30.0–36.0)
MCV: 83.4 fL (ref 78.0–100.0)
Platelets: 91 10*3/uL — ABNORMAL LOW (ref 150–400)
RBC: 3.67 MIL/uL — ABNORMAL LOW (ref 3.87–5.11)
RDW: 15.4 % (ref 11.5–15.5)
WBC: 10 10*3/uL (ref 4.0–10.5)

## 2014-05-07 MED ORDER — OXYCODONE-ACETAMINOPHEN 5-325 MG PO TABS: 1.0000 | ORAL_TABLET | ORAL | Status: DC | PRN

## 2014-05-07 NOTE — Plan of Care (Signed)
Problem: Consults Goal: Postpartum Patient Education (See Patient Education module for education specifics.)  Outcome: Completed/Met Date Met:  05/07/14  Problem: Discharge Progression Outcomes Goal: Barriers To Progression Addressed/Resolved Outcome: Completed/Met Date Met:  21/82/88 Goal: Complications resolved/controlled Outcome: Completed/Met Date Met:  05/07/14 Goal: Pain controlled with appropriate interventions Outcome: Completed/Met Date Met:  05/07/14 Goal: Afebrile, VS remain stable at discharge Outcome: Completed/Met Date Met:  05/07/14 Goal: Discharge plan in place and appropriate Outcome: Completed/Met Date Met:  05/07/14 Goal: Other Discharge Outcomes/Goals Outcome: Not Applicable Date Met:  33/74/45

## 2014-05-07 NOTE — Lactation Note (Signed)
This note was copied from the chart of [name] [name]. Lactation Consultation Note  Follow up visit made prior to discharge.  Mom states baby is latching easily and nursing well.  Reviewed basics.  Mom denies questions/concerns.  Encouraged lactation services prn.  Patient Name: [name] Read DriversChandra Logan Today's Date: 05/07/2014     Maternal Data    Feeding    LATCH Score/Interventions                      Lactation Tools Discussed/Used     Consult Status      Huston FoleyMOULDEN, Zaylyn Bergdoll S 05/07/2014, 10:43 AM

## 2014-05-07 NOTE — Discharge Summary (Signed)
Obstetric Discharge Summary  Reason for Admission: onset of labor - planned TOLAC Prenatal Procedures: none Intrapartum Procedures: spontaneous vaginal delivery - VBAC Postpartum Procedures: none Complications-Operative and Postpartum: vaginal laceration HEMOGLOBIN  Date Value Ref Range Status  05/07/2014 10.3* 12.0 - 15.0 g/dL Final   HCT  Date Value Ref Range Status  05/07/2014 30.6* 36.0 - 46.0 % Final    Physical Exam:  General: alert, cooperative and no distress Lochia: appropriate Uterine Fundus: firm Incision: healing well DVT Evaluation: No evidence of DVT seen on physical exam.  Discharge Diagnoses: Term Pregnancy-delivered / successful VBAC  / thrombocytopenia / mild ABL anemia  Discharge Information: Date: 05/07/2014 Activity: pelvic rest Diet: routine Medications: PNV and Percocet Condition: stable Instructions: refer to practice specific booklet Discharge to: home Follow-up Information    Follow up with Lenoard AdenAAVON,RICHARD J, MD. Schedule an appointment as soon as possible for a visit in 6 weeks.   Specialty:  Obstetrics and Gynecology   Contact information:   [address] [place] [id number] [phone]       Newborn Data: Live born female  Birth Weight: 6 lb 6.3 oz (2900 g) APGAR: 9, 9  Home with mother.  Marlinda MikeBAILEY, [name] Javid 05/07/2014, 11:42 AM

## 2014-05-07 NOTE — Progress Notes (Signed)
PPD #2 SVD, baby girl  S:  Reports feeling good, dressed and ready to go home             Tolerating po/ No nausea or vomiting             Bleeding is light             Pain controlled with narcotic analgesics including Percocet - Motrin D/C'd for low platelets             Up ad lib / ambulatory / voiding QS  Newborn breast feeding - well  O:               VS: BP 111/51 mmHg  Pulse 63  Temp(Src) 98.3 F (36.8 C) (Oral)  Resp 18  Ht 5\' 2"  (1.575 m)  Wt 88.451 kg (195 lb)  BMI 35.66 kg/m2  SpO2 100%  Breastfeeding? Unknown   LABS:              Recent Labs  05/06/14 0540 05/07/14 0626  WBC 13.5* 10.0  HGB 9.6* 10.3*  PLT 69* 91*               Blood type: --/--/B POS (11/19 0540)  Rubella: Immune (04/25 0000)                     I&O: Intake/Output      11/20 0701 - 11/21 0700 11/21 0701 - 11/22 0700   P.O. 240    Total Intake(mL/kg) 240 (2.7)    Net +240          Urine Occurrence 1 x                  Physical Exam:             Alert and oriented X3  Lungs: Clear and unlabored  Heart: regular rate and rhythm / no mumurs  Abdomen: soft, non-tender, non-distended              Fundus: firm, non-tender, U-1  Perineum: approximated   Lochia: scant  Extremities: no edema, no calf pain or tenderness    A: PPD # 2   Doing well - stable status             Gestational Thrombocytopenia - improving   P: Discharge home today             WOB discharge book given and reviewed             Offer flu vaccine prior to discharge                Milinda CaveMeredith Virgie Chery, SNM

## 2014-06-03 ENCOUNTER — Encounter (HOSPITAL_COMMUNITY): Payer: Self-pay | Admitting: General Practice

## 2014-06-06 ENCOUNTER — Other Ambulatory Visit: Payer: Self-pay | Admitting: Obstetrics and Gynecology

## 2014-06-07 MED ORDER — CEFAZOLIN SODIUM-DEXTROSE 2-3 GM-% IV SOLR
2.0000 g | INTRAVENOUS | Status: AC
  Administered 2014-06-08: 2 g via INTRAVENOUS

## 2014-06-07 NOTE — H&P (Signed)
NAMRead Drivers:  [name], [name]           ACCOUNT NO.:  [id number]  MEDICAL RECORD NO.:  [id number]  LOCATION:  [facility]                         FACILITY:  [facility]  PHYSICIAN:  Lenoard Adenichard J. Lyndee Herbst, M.D.DATE OF BIRTH:  04-Nov-1975  DATE OF ADMISSION:  06/08/2014 DATE OF DISCHARGE:                             HISTORY & PHYSICAL   CHIEF COMPLAINT:  Desire for elective sterilization.  HISTORY OF PRESENT ILLNESS:  The patient is a 38 year old African American female G5, P3, who presents for elective sterilization.  She has no known drug allergies.  Medications are prenatal vitamins.  She has a family history of diabetes, anemia, hypertension and kidney disease.  She has a pregnancy history for known abortion, known miscarriage, one C- section and two vaginal deliveries.  PHYSICAL EXAMINATION:  GENERAL:  She is a well-developed, well- nourished, African American female, in no acute distress. HEENT:  Normal. NECK:  Supple.  Full range of motion. LUNGS:  Clear. HEART:  Regular rate and rhythm. ABDOMEN:  Soft, nontender. PELVIC EXAM:  Reveals a normal-sized uterus with no adnexal masses. EXTREMITIES:  There are no cords. NEUROLOGIC:  Nonfocal. SKIN:  Intact.  IMPRESSION:  Desire for elective sterilization.  The patient declined Essure.  The patient declined Mirena.  PLAN:  Proceed with laparoscopic tubal ligation.  Risks of anesthesia, infection, bleeding, injury to surrounding organs, and possible need for repair was discussed.  Delayed versus immediate complications to include bowel and bladder injury are noted.  The patient acknowledges and wishes to proceed.     Lenoard Adenichard J. Reha Martinovich, M.D.     RJT/MEDQ  D:  06/07/2014  T:  06/07/2014  Job:  [id number]

## 2014-06-08 ENCOUNTER — Ambulatory Visit (HOSPITAL_COMMUNITY)
Admission: RE | Admit: 2014-06-08 | Discharge: 2014-06-08 | Disposition: A | Discharge status: Home / Self Care | Payer: BC Managed Care – PPO | Source: Ambulatory Visit | Attending: Obstetrics and Gynecology | Admitting: Obstetrics and Gynecology

## 2014-06-08 ENCOUNTER — Ambulatory Visit (HOSPITAL_COMMUNITY): Payer: BC Managed Care – PPO | Admitting: Anesthesiology

## 2014-06-08 ENCOUNTER — Encounter (HOSPITAL_COMMUNITY)
Admission: RE | Disposition: A | Discharge status: Home / Self Care | Payer: Self-pay | Source: Ambulatory Visit | Attending: Obstetrics and Gynecology

## 2014-06-08 DIAGNOSIS — Z302 Encounter for sterilization: Secondary | ICD-10-CM | POA: Insufficient documentation

## 2014-06-08 DIAGNOSIS — Z87891 Personal history of nicotine dependence: Secondary | ICD-10-CM | POA: Insufficient documentation

## 2014-06-08 HISTORY — PX: LAPAROSCOPIC TUBAL LIGATION: SHX1937

## 2014-06-08 LAB — CBC
HEMATOCRIT: 38.2 % (ref 36.0–46.0)
Hemoglobin: 12.8 g/dL (ref 12.0–15.0)
MCH: 27.6 pg (ref 26.0–34.0)
MCHC: 33.5 g/dL (ref 30.0–36.0)
MCV: 82.5 fL (ref 78.0–100.0)
Platelets: 152 10*3/uL (ref 150–400)
RBC: 4.63 MIL/uL (ref 3.87–5.11)
RDW: 14.1 % (ref 11.5–15.5)
WBC: 5.5 10*3/uL (ref 4.0–10.5)

## 2014-06-08 LAB — HCG, SERUM, QUALITATIVE: Preg, Serum: NEGATIVE

## 2014-06-08 SURGERY — LIGATION, FALLOPIAN TUBE, LAPAROSCOPIC
Anesthesia: General | Site: Abdomen | Laterality: Bilateral

## 2014-06-08 MED ORDER — ACETAMINOPHEN 325 MG PO TABS: 325.0000 mg | ORAL_TABLET | ORAL | Status: DC | PRN

## 2014-06-08 MED ORDER — BUPIVACAINE HCL (PF) 0.25 % IJ SOLN
INTRAMUSCULAR | Status: AC
  Filled 2014-06-08: qty 30

## 2014-06-08 MED ORDER — FENTANYL CITRATE 0.05 MG/ML IJ SOLN
INTRAMUSCULAR | Status: DC | PRN
  Administered 2014-06-08: 100 ug via INTRAVENOUS
  Administered 2014-06-08: 50 ug via INTRAVENOUS
  Administered 2014-06-08: 100 ug via INTRAVENOUS

## 2014-06-08 MED ORDER — OXYCODONE-ACETAMINOPHEN 5-325 MG PO TABS: 1.0000 | ORAL_TABLET | ORAL | Status: AC | PRN

## 2014-06-08 MED ORDER — MIDAZOLAM HCL 5 MG/5ML IJ SOLN
INTRAMUSCULAR | Status: DC | PRN
  Administered 2014-06-08: 2 mg via INTRAVENOUS

## 2014-06-08 MED ORDER — CEFAZOLIN SODIUM-DEXTROSE 2-3 GM-% IV SOLR
INTRAVENOUS | Status: AC
  Filled 2014-06-08: qty 50

## 2014-06-08 MED ORDER — NEOSTIGMINE METHYLSULFATE 10 MG/10ML IV SOLN
INTRAVENOUS | Status: DC | PRN
  Administered 2014-06-08: 3 mg via INTRAVENOUS

## 2014-06-08 MED ORDER — LACTATED RINGERS IV SOLN
INTRAVENOUS | Status: DC
  Administered 2014-06-08 (×2): via INTRAVENOUS

## 2014-06-08 MED ORDER — PROMETHAZINE HCL 25 MG/ML IJ SOLN: 6.2500 mg | INTRAMUSCULAR | Status: DC | PRN

## 2014-06-08 MED ORDER — ONDANSETRON HCL 4 MG/2ML IJ SOLN
INTRAMUSCULAR | Status: DC | PRN
  Administered 2014-06-08: 4 mg via INTRAVENOUS

## 2014-06-08 MED ORDER — LIDOCAINE HCL (CARDIAC) 20 MG/ML IV SOLN
INTRAVENOUS | Status: AC
  Filled 2014-06-08: qty 5

## 2014-06-08 MED ORDER — FENTANYL CITRATE 0.05 MG/ML IJ SOLN
25.0000 ug | INTRAMUSCULAR | Status: DC | PRN
  Administered 2014-06-08: 50 ug via INTRAVENOUS
  Administered 2014-06-08: 25 ug via INTRAVENOUS

## 2014-06-08 MED ORDER — SCOPOLAMINE 1 MG/3DAYS TD PT72
1.0000 | MEDICATED_PATCH | Freq: Once | TRANSDERMAL | Status: DC
  Administered 2014-06-08: 1.5 mg via TRANSDERMAL

## 2014-06-08 MED ORDER — MEPERIDINE HCL 25 MG/ML IJ SOLN
6.2500 mg | INTRAMUSCULAR | Status: DC | PRN
Start: 2014-06-08 — End: 2014-06-08

## 2014-06-08 MED ORDER — DEXAMETHASONE SODIUM PHOSPHATE 4 MG/ML IJ SOLN
INTRAMUSCULAR | Status: DC | PRN
  Administered 2014-06-08: 10 mg via INTRAVENOUS

## 2014-06-08 MED ORDER — SCOPOLAMINE 1 MG/3DAYS TD PT72
MEDICATED_PATCH | TRANSDERMAL | Status: AC
  Administered 2014-06-08: 1.5 mg via TRANSDERMAL
  Filled 2014-06-08: qty 1

## 2014-06-08 MED ORDER — LIDOCAINE HCL (CARDIAC) 20 MG/ML IV SOLN
INTRAVENOUS | Status: DC | PRN
  Administered 2014-06-08: 50 mg via INTRAVENOUS

## 2014-06-08 MED ORDER — MIDAZOLAM HCL 2 MG/2ML IJ SOLN
INTRAMUSCULAR | Status: AC
  Filled 2014-06-08: qty 2

## 2014-06-08 MED ORDER — MIDAZOLAM HCL 2 MG/2ML IJ SOLN: INTRAMUSCULAR | Status: DC | PRN

## 2014-06-08 MED ORDER — GLYCOPYRROLATE 0.2 MG/ML IJ SOLN
INTRAMUSCULAR | Status: DC | PRN
  Administered 2014-06-08: .6 mg via INTRAVENOUS

## 2014-06-08 MED ORDER — BUPIVACAINE HCL (PF) 0.25 % IJ SOLN
INTRAMUSCULAR | Status: DC | PRN
  Administered 2014-06-08: 20 mL

## 2014-06-08 MED ORDER — BUPIVACAINE HCL (PF) 0.25 % IJ SOLN
INTRAMUSCULAR | Status: AC
  Filled 2014-06-08: qty 10

## 2014-06-08 MED ORDER — ONDANSETRON HCL 4 MG/2ML IJ SOLN
INTRAMUSCULAR | Status: AC
  Filled 2014-06-08: qty 2

## 2014-06-08 MED ORDER — KETOROLAC TROMETHAMINE 30 MG/ML IJ SOLN: 30.0000 mg | Freq: Once | INTRAMUSCULAR | Status: DC | PRN

## 2014-06-08 MED ORDER — ROCURONIUM BROMIDE 100 MG/10ML IV SOLN
INTRAVENOUS | Status: DC | PRN
  Administered 2014-06-08: 10 mg via INTRAVENOUS
  Administered 2014-06-08: 30 mg via INTRAVENOUS

## 2014-06-08 MED ORDER — FENTANYL CITRATE 0.05 MG/ML IJ SOLN
INTRAMUSCULAR | Status: DC
Start: 2014-06-08 — End: 2014-06-08
  Filled 2014-06-08: qty 2

## 2014-06-08 MED ORDER — PROPOFOL 10 MG/ML IV EMUL
INTRAVENOUS | Status: AC
  Filled 2014-06-08: qty 20

## 2014-06-08 MED ORDER — ACETAMINOPHEN 160 MG/5ML PO SOLN: 325.0000 mg | ORAL | Status: DC | PRN

## 2014-06-08 MED ORDER — PROPOFOL INFUSION 10 MG/ML OPTIME
INTRAVENOUS | Status: DC | PRN
  Administered 2014-06-08: 2 mL via INTRAVENOUS
  Administered 2014-06-08: 18 mL via INTRAVENOUS

## 2014-06-08 MED ORDER — MIDAZOLAM HCL 2 MG/2ML IJ SOLN: 0.5000 mg | Freq: Once | INTRAMUSCULAR | Status: DC | PRN

## 2014-06-08 MED ORDER — FENTANYL CITRATE 0.05 MG/ML IJ SOLN
INTRAMUSCULAR | Status: AC
Start: 2014-06-08 — End: 2014-06-08
  Filled 2014-06-08: qty 5

## 2014-06-08 MED ORDER — DEXAMETHASONE SODIUM PHOSPHATE 10 MG/ML IJ SOLN
INTRAMUSCULAR | Status: AC
  Filled 2014-06-08: qty 1

## 2014-06-08 SURGICAL SUPPLY — 17 items
CATH ROBINSON RED A/P 16FR (CATHETERS) ×3 IMPLANT
CLOTH BEACON ORANGE TIMEOUT ST (SAFETY) ×3 IMPLANT
DRSG COVADERM PLUS 2X2 (GAUZE/BANDAGES/DRESSINGS) ×6 IMPLANT
DRSG OPSITE POSTOP 3X4 (GAUZE/BANDAGES/DRESSINGS) ×3 IMPLANT
GLOVE BIO SURGEON STRL SZ7.5 (GLOVE) ×3 IMPLANT
GOWN STRL REUS W/TWL LRG LVL3 (GOWN DISPOSABLE) ×6 IMPLANT
LIQUID BAND (GAUZE/BANDAGES/DRESSINGS) ×3 IMPLANT
NEEDLE INSUFFLATION 120MM (ENDOMECHANICALS) ×3 IMPLANT
PACK LAPAROSCOPY BASIN (CUSTOM PROCEDURE TRAY) ×3 IMPLANT
PAD TRENDELENBURG OR TABLE (MISCELLANEOUS) ×3 IMPLANT
SOLUTION ELECTROLUBE (MISCELLANEOUS) ×3 IMPLANT
SUT VICRYL 0 UR6 27IN ABS (SUTURE) ×3 IMPLANT
SUT VICRYL 4-0 PS2 18IN ABS (SUTURE) ×3 IMPLANT
TOWEL OR 17X24 6PK STRL BLUE (TOWEL DISPOSABLE) ×6 IMPLANT
TROCAR XCEL DIL TIP R 11M (ENDOMECHANICALS) ×3 IMPLANT
WARMER LAPAROSCOPE (MISCELLANEOUS) ×3 IMPLANT
WATER STERILE IRR 1000ML POUR (IV SOLUTION) ×3 IMPLANT

## 2014-06-08 NOTE — Anesthesia Preprocedure Evaluation (Signed)
Anesthesia Evaluation  Patient identified by MRN, date of birth, ID band Patient awake    Reviewed: Allergy & Precautions, H&P , Patient's Chart, lab work & pertinent test results, reviewed documented beta blocker date and time   History of Anesthesia Complications Negative for: history of anesthetic complications  Airway Mallampati: II  TM Distance: >3 FB Neck ROM: full    Dental   Pulmonary Current Smoker,  breath sounds clear to auscultation        Cardiovascular Exercise Tolerance: Good Rhythm:regular Rate:Normal     Neuro/Psych    GI/Hepatic   Endo/Other    Renal/GU      Musculoskeletal   Abdominal   Peds  Hematology   Anesthesia Other Findings   Reproductive/Obstetrics                             Anesthesia Physical Anesthesia Plan  ASA: II  Anesthesia Plan: General ETT   Post-op Pain Management:    Induction:   Airway Management Planned:   Additional Equipment:   Intra-op Plan:   Post-operative Plan:   Informed Consent: I have reviewed the patients History and Physical, chart, labs and discussed the procedure including the risks, benefits and alternatives for the proposed anesthesia with the patient or authorized representative who has indicated his/her understanding and acceptance.   Dental Advisory Given  Plan Discussed with: CRNA and Surgeon  Anesthesia Plan Comments:         Anesthesia Quick Evaluation  

## 2014-06-08 NOTE — Op Note (Signed)
06/08/2014  11:34 AM  PATIENT:  [name] [name]  38 y.o. female  PRE-OPERATIVE DIAGNOSIS:  Desires Sterilization  POST-OPERATIVE DIAGNOSIS:  desires sterilization  PROCEDURE:  Procedure(s): LAPAROSCOPIC TUBAL LIGATION  SURGEON:  Surgeon(s): Lenoard Adenichard J Aly Seidenberg, MD  ASSISTANTS: none   ANESTHESIA:   local and general  ESTIMATED BLOOD LOSS: minimal  DRAINS: none   LOCAL MEDICATIONS USED:  MARCAINE    and Amount: 20 ml  SPECIMEN:  No Specimen  DISPOSITION OF SPECIMEN:  N/A  COUNTS:  YES  DICTATION #: [id number]  PLAN OF CARE: dc home  PATIENT DISPOSITION:  PACU - hemodynamically stable.

## 2014-06-08 NOTE — Transfer of Care (Signed)
Immediate Anesthesia Transfer of Care Note  Patient: [name] [name]  Procedure(s) Performed: Procedure(s): LAPAROSCOPIC TUBAL LIGATION (Bilateral)  Patient Location: PACU  Anesthesia Type:General  Level of Consciousness: awake, alert  and oriented  Airway & Oxygen Therapy: Patient Spontanous Breathing and Patient connected to nasal cannula oxygen  Post-op Assessment: Report given to PACU RN  Post vital signs: Reviewed  Complications: No apparent anesthesia complications

## 2014-06-08 NOTE — Op Note (Signed)
NAMRead Drivers:  Toren, Lizbeth           ACCOUNT NO.:  [id number]  MEDICAL RECORD NO.:  [id number]  LOCATION:  [facility]                          FACILITY:  [facility]  PHYSICIAN:  Lenoard Adenichard J. Valor [place], M.D.DATE OF BIRTH:  [birth year]  DATE OF PROCEDURE:  06/08/2014 DATE OF DISCHARGE:  06/08/2014                              OPERATIVE REPORT   PREOPERATIVE DIAGNOSIS:  Desire for elective sterilization.  POSTOPERATIVE DIAGNOSIS:  Desire for elective sterilization.  PROCEDURE:  Laparoscopic tubal sterilization.  SURGEON:  Lenoard Adenichard J. Clarke Peretz, M.D.  ASSISTANT:  None.  ANESTHESIA:  General.  ESTIMATED BLOOD LOSS:  Less than 50 mL.  DRAINS:  None.  COUNTS:  Correct.  SPECIMEN:  None.  DISPOSITION:  The patient to recovery in good condition.  BRIEF OPERATIVE NOTE:  After being apprised of risks of anesthesia, infection, bleeding, and no surrounding organs, possible need for repair, delayed versus immediate complications to include bowel and bladder injury, possible need for repair, failure risk of tubal ligation of 5 to 03/999, the patient was brought to the operating room, she was administered general anesthetic without complications.  Prepped and draped in usual sterile fashion.  Feet were placed in Yellofin stirrups. Exam under anesthesia reveals an anteflexed uterus and no adnexal masses.  At this time, Hulka tenaculum placed per vagina without difficulty.  Infraumbilical incision made with a scalpel.  Veress needle placed opening pressure of -2, 3 L of CO2 insufflated without difficulty.  Atraumatic trocar placement, 10 mm trocar placed without difficulty.  Visualization reveals atraumatic trocar entry.  Normal liver, gallbladder area, normal appendiceal area.  No evidence of pelvic adhesions.  Bilateral normal tubes and ovaries.  Normal anterior and posterior cul-de-sac.  At this time, the right tube was traced out to the fimbriated end and ampullary-isthmic portion of the tube  was identified, cauterized using bipolar cautery in 3 contiguous areas.  The same procedure was done on the left side as well.  Both tubes were visualized, divided, tubal lumens were noted.  At this time, Marcaine 10 mL total was dripped down to both tubes without difficulty.  The CO2 was released.  Good hemostasis was noted.  An incision was then closed using 0 Vicryl, 4-0 Vicryl, and Dermabond.  Hulka tenaculum removed from the vagina.  The patient tolerated the procedure well and was sent to recovery room in good condition.     Lenoard Adenichard J. Landin Tallon, M.D.     RJT/MEDQ  D:  06/08/2014  T:  06/08/2014  Job:  [id number]

## 2014-06-08 NOTE — Anesthesia Postprocedure Evaluation (Signed)
Anesthesia Post Note  Patient: [name] [name]  Procedure(s) Performed: Procedure(s) (LRB): LAPAROSCOPIC TUBAL LIGATION (Bilateral)  Anesthesia type: GA  Patient location: PACU  Post pain: Pain level controlled  Post assessment: Post-op Vital signs reviewed  Last Vitals:  Filed Vitals:   06/08/14 1115  BP:   Pulse:   Temp:   Resp: 16    Post vital signs: Reviewed  Level of consciousness: sedated  Complications: No apparent anesthesia complications

## 2014-06-08 NOTE — Progress Notes (Signed)
Patient ID: [name] DriversChandra Rudge, female   DOB: 12/14/1975, 38 y.o.   MRN: [id number] Patient seen and examined. Consent witnessed and signed. No changes noted. Update completed.

## 2014-06-08 NOTE — Discharge Instructions (Signed)

## 2014-06-10 ENCOUNTER — Encounter (HOSPITAL_COMMUNITY): Payer: Self-pay | Admitting: Obstetrics and Gynecology

## 2014-11-07 ENCOUNTER — Ambulatory Visit
Admission: RE | Admit: 2014-11-07 | Discharge: 2014-11-07 | Disposition: A | Discharge status: Home / Self Care | Payer: BLUE CROSS/BLUE SHIELD | Source: Ambulatory Visit | Attending: Internal Medicine | Admitting: Internal Medicine

## 2014-11-07 ENCOUNTER — Other Ambulatory Visit (HOSPITAL_COMMUNITY): Payer: Self-pay | Admitting: Respiratory Therapy

## 2014-11-07 ENCOUNTER — Other Ambulatory Visit: Payer: Self-pay | Admitting: Internal Medicine

## 2014-11-07 DIAGNOSIS — R0602 Shortness of breath: Secondary | ICD-10-CM

## 2014-11-22 ENCOUNTER — Encounter (HOSPITAL_COMMUNITY): Payer: BLUE CROSS/BLUE SHIELD

## 2014-12-08 ENCOUNTER — Ambulatory Visit (INDEPENDENT_AMBULATORY_CARE_PROVIDER_SITE_OTHER): Payer: BLUE CROSS/BLUE SHIELD | Admitting: Family Medicine

## 2014-12-08 VITALS — BP 114/74 | HR 69 | Temp 99.3°F | Resp 20 | Ht 64.0 in | Wt 180.5 lb

## 2014-12-08 DIAGNOSIS — H109 Unspecified conjunctivitis: Secondary | ICD-10-CM | POA: Diagnosis not present

## 2014-12-08 DIAGNOSIS — J029 Acute pharyngitis, unspecified: Secondary | ICD-10-CM

## 2014-12-08 MED ORDER — POLYMYXIN B-TRIMETHOPRIM 10000-0.1 UNIT/ML-% OP SOLN: 1.0000 [drp] | Freq: Four times a day (QID) | OPHTHALMIC | Status: AC

## 2014-12-08 NOTE — Patient Instructions (Signed)

## 2014-12-08 NOTE — Progress Notes (Signed)
Subjective:    Patient ID: [name] [name], female    DOB: 1976/03/30, 39 y.o.   MRN: [id number]  12/08/2014  Conjunctivitis   HPI This 39 y.o. female presents for evaluation of eye drainage B.    Onset last Friday with mild eye involvement.  Eyes were red in one corner of one eye only with onset.  Then two days ago, took daughter to pediatrician; daughter with red eyes; pediatrician prescribed abx eye drops.  Then pt awoke two days ago with itchy eyes, aching eyes, eyes sealed closed.  No fever/chills/sweats.  No headache.  +ST mild; no ear pain. No rhinorrhea.  No cough.  No n/v/d.  Mild pain with swallowing.  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  No OTC medications.  No Tylenol or Motrin.  No photophobia; no FB sensation; does not wear contacts.  PCP: Abby Jawala/[facility]   Review of Systems  Constitutional: Negative for fever, chills, diaphoresis and fatigue.  HENT: Positive for sore throat. Negative for congestion, ear pain, postnasal drip, rhinorrhea, trouble swallowing and voice change.   Eyes: Positive for discharge, redness and itching. Negative for photophobia, pain and visual disturbance.  Respiratory: Negative for cough and shortness of breath.   Gastrointestinal: Negative for nausea, vomiting and diarrhea.  Skin: Negative for rash.  Neurological: Negative for headaches.    Past Medical History  Diagnosis Date  . Postpartum care following cesarean delivery 01/18/2011  . Gestational thrombocytopenia without hemorrhage 01/19/2011  . Postpartum care following vaginal birth after cesarean (11/19) 05/05/2014  . Acute blood loss anemia 05/06/2014    with pregnancy   Past Surgical History  Procedure Laterality Date  . Cesarean section  01/17/2011    Procedure: CESAREAN SECTION;  Surgeon: Tresa Endo A. Fogleman;  Location: [facility];  Service: Gynecology;  Laterality: N/A;  . Ankle fracture surgery Right   . Laparoscopic tubal ligation Bilateral 06/08/2014    Procedure:  LAPAROSCOPIC TUBAL LIGATION;  Surgeon: Lenoard Aden, MD;  Location: [facility];  Service: Gynecology;  Laterality: Bilateral;   No Known Allergies Current Outpatient Prescriptions  Medication Sig Dispense Refill  . hydrochlorothiazide (MICROZIDE) 12.5 MG capsule Take 12.5 mg by mouth daily.    . valACYclovir (VALTREX) 1000 MG tablet Take 500 mg by mouth 2 (two) times daily.    Marland Kitchen oxyCODONE-acetaminophen (PERCOCET/ROXICET) 5-325 MG per tablet Take 1-2 tablets by mouth every 4 (four) hours as needed (for pain scale less than 7). (Patient not taking: Reported on 12/08/2014) 30 tablet 0  . Prenatal Vit-Fe Fumarate-FA (PRENATAL MULTIVITAMIN) TABS tablet Take 1 tablet by mouth daily at 12 noon.    . trimethoprim-polymyxin b (POLYTRIM) ophthalmic solution Place 1 drop into both eyes every 6 (six) hours. 10 mL 0   No current facility-administered medications for this visit.       Objective:    BP 114/74 mmHg  Pulse 69  Temp(Src) 99.3 F (37.4 C) (Oral)  Resp 20  Ht  (1.626 m)  Wt 180 lb 8 oz (81.874 kg)  BMI 30.97 kg/m2  SpO2 98%  LMP 11/13/2014 Physical Exam  Constitutional: She is oriented to person, place, and time. She appears well-developed and well-nourished. No distress.  HENT:  Head: Normocephalic and atraumatic.  Right Ear: External ear normal.  Left Ear: External ear normal.  Nose: Nose normal.  Mouth/Throat: Oropharynx is clear and moist. No oropharyngeal exudate.  Eyes: EOM and lids are normal. Pupils are equal, round, and reactive to light. Right eye exhibits no discharge.  Left eye exhibits no discharge. Right conjunctiva is injected. Left conjunctiva is injected.  Neck: Normal range of motion. Neck supple. No thyromegaly present.  Cardiovascular: Normal rate, regular rhythm and normal heart sounds.  Exam reveals no gallop and no friction rub.   No murmur heard. Pulmonary/Chest: Effort normal and breath sounds normal. She has no wheezes. She has no rales.    Lymphadenopathy:    She has no cervical adenopathy.  Neurological: She is alert and oriented to person, place, and time.  Skin: She is not diaphoretic.  Psychiatric: She has a normal mood and affect. Her behavior is normal.  Nursing note and vitals reviewed.       Assessment & Plan:   1. Bilateral conjunctivitis   2. Sore throat     -New. -Consistent with viral syndrome. -Daughter treated with abx eye drop; thus, will treat with Polytrim. -Supportive care with Ibuprofen, salt water gargles. -RTC for acute worsening.   Meds ordered this encounter  Medications  . hydrochlorothiazide (MICROZIDE) 12.5 MG capsule    Sig: Take 12.5 mg by mouth daily.  Marland Kitchen trimethoprim-polymyxin b (POLYTRIM) ophthalmic solution    Sig: Place 1 drop into both eyes every 6 (six) hours.    Dispense:  10 mL    Refill:  0    No Follow-up on file.     Aleshia Cartelli Paulita Fujita, M.D. [facility] [address] [place], [place]  [address] [phone] phone [phone] fax

## 2014-12-12 ENCOUNTER — Ambulatory Visit (HOSPITAL_COMMUNITY)
Admission: RE | Admit: 2014-12-12 | Discharge: 2014-12-12 | Disposition: A | Discharge status: Home / Self Care | Payer: BLUE CROSS/BLUE SHIELD | Source: Ambulatory Visit | Attending: Internal Medicine | Admitting: Internal Medicine

## 2014-12-12 DIAGNOSIS — R0602 Shortness of breath: Secondary | ICD-10-CM | POA: Insufficient documentation

## 2014-12-12 LAB — PULMONARY FUNCTION TEST
DL/VA % pred: 142 %
DL/VA: 6.46 ml/min/mmHg/L
DLCO unc % pred: 108 %
DLCO unc: 23.48 ml/min/mmHg
FEF 25-75 PRE: 3.86 L/s
FEF 25-75 Post: 2.89 L/sec
FEF2575-%Change-Post: -25 %
FEF2575-%PRED-PRE: 143 %
FEF2575-%Pred-Post: 107 %
FEV1-%CHANGE-POST: -7 %
FEV1-%PRED-POST: 96 %
FEV1-%Pred-Pre: 104 %
FEV1-PRE: 2.48 L
FEV1-Post: 2.29 L
FEV1FVC-%Change-Post: 0 %
FEV1FVC-%Pred-Pre: 106 %
FEV6-%Change-Post: -8 %
FEV6-%PRED-POST: 90 %
FEV6-%Pred-Pre: 98 %
FEV6-PRE: 2.77 L
FEV6-Post: 2.54 L
FEV6FVC-%PRED-PRE: 102 %
FEV6FVC-%Pred-Post: 102 %
FVC-%CHANGE-POST: -8 %
FVC-%PRED-POST: 88 %
FVC-%Pred-Pre: 96 %
FVC-Post: 2.54 L
FVC-Pre: 2.77 L
POST FEV6/FVC RATIO: 100 %
Post FEV1/FVC ratio: 90 %
Pre FEV1/FVC ratio: 90 %
Pre FEV6/FVC Ratio: 100 %
RV % pred: 83 %
RV: 1.23 L
TLC % PRED: 84 %
TLC: 4.02 L

## 2014-12-12 MED ORDER — ALBUTEROL SULFATE (2.5 MG/3ML) 0.083% IN NEBU
2.5000 mg | INHALATION_SOLUTION | Freq: Once | RESPIRATORY_TRACT | Status: AC
  Administered 2014-12-12: 2.5 mg via RESPIRATORY_TRACT

## 2019-10-27 ENCOUNTER — Other Ambulatory Visit: Payer: Self-pay | Admitting: Obstetrics and Gynecology

## 2019-10-27 DIAGNOSIS — N6459 Other signs and symptoms in breast: Secondary | ICD-10-CM

## 2019-11-05 ENCOUNTER — Ambulatory Visit
Admission: RE | Admit: 2019-11-05 | Discharge: 2019-11-05 | Disposition: A | Discharge status: Home / Self Care | Payer: BLUE CROSS/BLUE SHIELD | Source: Ambulatory Visit | Attending: Obstetrics and Gynecology | Admitting: Obstetrics and Gynecology

## 2019-11-05 ENCOUNTER — Other Ambulatory Visit: Payer: Self-pay | Admitting: Obstetrics and Gynecology

## 2019-11-05 ENCOUNTER — Other Ambulatory Visit: Payer: Self-pay

## 2019-11-05 DIAGNOSIS — N6459 Other signs and symptoms in breast: Secondary | ICD-10-CM

## 2022-03-31 DIAGNOSIS — R011 Cardiac murmur, unspecified: Secondary | ICD-10-CM | POA: Diagnosis not present

## 2022-03-31 DIAGNOSIS — M62421 Contracture of muscle, right upper arm: Secondary | ICD-10-CM | POA: Diagnosis not present

## 2022-03-31 DIAGNOSIS — S40021A Contusion of right upper arm, initial encounter: Secondary | ICD-10-CM | POA: Diagnosis not present

## 2022-08-24 DIAGNOSIS — M7701 Medial epicondylitis, right elbow: Secondary | ICD-10-CM | POA: Diagnosis not present

## 2022-08-24 DIAGNOSIS — M79601 Pain in right arm: Secondary | ICD-10-CM | POA: Diagnosis not present

## 2022-08-24 DIAGNOSIS — M7711 Lateral epicondylitis, right elbow: Secondary | ICD-10-CM | POA: Diagnosis not present

## 2023-01-23 DIAGNOSIS — R21 Rash and other nonspecific skin eruption: Secondary | ICD-10-CM | POA: Diagnosis not present

## 2023-01-31 DIAGNOSIS — R21 Rash and other nonspecific skin eruption: Secondary | ICD-10-CM | POA: Diagnosis not present

## 2023-04-02 DIAGNOSIS — Z124 Encounter for screening for malignant neoplasm of cervix: Secondary | ICD-10-CM | POA: Diagnosis not present

## 2023-04-21 DIAGNOSIS — N939 Abnormal uterine and vaginal bleeding, unspecified: Secondary | ICD-10-CM | POA: Diagnosis not present

## 2023-06-17 ENCOUNTER — Encounter (HOSPITAL_BASED_OUTPATIENT_CLINIC_OR_DEPARTMENT_OTHER): Payer: Self-pay | Admitting: Emergency Medicine

## 2023-06-17 DIAGNOSIS — H53149 Visual discomfort, unspecified: Secondary | ICD-10-CM | POA: Diagnosis not present

## 2023-06-17 DIAGNOSIS — R519 Headache, unspecified: Secondary | ICD-10-CM | POA: Insufficient documentation

## 2023-06-17 DIAGNOSIS — R110 Nausea: Secondary | ICD-10-CM | POA: Insufficient documentation

## 2023-06-17 DIAGNOSIS — Z20822 Contact with and (suspected) exposure to covid-19: Secondary | ICD-10-CM | POA: Insufficient documentation

## 2023-06-17 LAB — RESP PANEL BY RT-PCR (RSV, FLU A&B, COVID)  RVPGX2
Influenza A by PCR: NEGATIVE
Influenza B by PCR: NEGATIVE
Resp Syncytial Virus by PCR: NEGATIVE
SARS Coronavirus 2 by RT PCR: NEGATIVE

## 2023-06-17 NOTE — ED Triage Notes (Signed)
 Headache since Saturday Not relieved with OTC meds Constant Some nausea Can sleep due to pain

## 2023-06-18 ENCOUNTER — Emergency Department (HOSPITAL_BASED_OUTPATIENT_CLINIC_OR_DEPARTMENT_OTHER): Payer: Federal, State, Local not specified - PPO

## 2023-06-18 ENCOUNTER — Emergency Department (HOSPITAL_BASED_OUTPATIENT_CLINIC_OR_DEPARTMENT_OTHER)
Admission: EM | Admit: 2023-06-18 | Discharge: 2023-06-18 | Disposition: A | Discharge status: Home / Self Care | Payer: BC Managed Care – PPO | Attending: Emergency Medicine | Admitting: Emergency Medicine

## 2023-06-18 DIAGNOSIS — R110 Nausea: Secondary | ICD-10-CM | POA: Diagnosis not present

## 2023-06-18 DIAGNOSIS — Z20822 Contact with and (suspected) exposure to covid-19: Secondary | ICD-10-CM | POA: Diagnosis not present

## 2023-06-18 DIAGNOSIS — R519 Headache, unspecified: Secondary | ICD-10-CM | POA: Diagnosis not present

## 2023-06-18 DIAGNOSIS — H53149 Visual discomfort, unspecified: Secondary | ICD-10-CM | POA: Diagnosis not present

## 2023-06-18 LAB — CBC WITH DIFFERENTIAL/PLATELET
Abs Immature Granulocytes: 0.02 10*3/uL (ref 0.00–0.07)
Basophils Absolute: 0 10*3/uL (ref 0.0–0.1)
Basophils Relative: 1 %
Eosinophils Absolute: 0.1 10*3/uL (ref 0.0–0.5)
Eosinophils Relative: 2 %
HCT: 39 % (ref 36.0–46.0)
Hemoglobin: 12.9 g/dL (ref 12.0–15.0)
Immature Granulocytes: 0 %
Lymphocytes Relative: 34 %
Lymphs Abs: 2.7 10*3/uL (ref 0.7–4.0)
MCH: 26.9 pg (ref 26.0–34.0)
MCHC: 33.1 g/dL (ref 30.0–36.0)
MCV: 81.3 fL (ref 80.0–100.0)
Monocytes Absolute: 0.7 10*3/uL (ref 0.1–1.0)
Monocytes Relative: 9 %
Neutro Abs: 4.5 10*3/uL (ref 1.7–7.7)
Neutrophils Relative %: 54 %
Platelets: 205 10*3/uL (ref 150–400)
RBC: 4.8 MIL/uL (ref 3.87–5.11)
RDW: 14.3 % (ref 11.5–15.5)
WBC: 8.1 10*3/uL (ref 4.0–10.5)
nRBC: 0 % (ref 0.0–0.2)

## 2023-06-18 LAB — COMPREHENSIVE METABOLIC PANEL
ALT: 12 U/L (ref 0–44)
AST: 12 U/L — ABNORMAL LOW (ref 15–41)
Albumin: 4.1 g/dL (ref 3.5–5.0)
Alkaline Phosphatase: 65 U/L (ref 38–126)
Anion gap: 7 (ref 5–15)
BUN: 11 mg/dL (ref 6–20)
CO2: 27 mmol/L (ref 22–32)
Calcium: 8.7 mg/dL — ABNORMAL LOW (ref 8.9–10.3)
Chloride: 103 mmol/L (ref 98–111)
Creatinine, Ser: 0.74 mg/dL (ref 0.44–1.00)
GFR, Estimated: >60 mL/min (ref >60)
Glucose, Bld: 100 mg/dL — ABNORMAL HIGH (ref 70–99)
Potassium: 3.3 mmol/L — ABNORMAL LOW (ref 3.5–5.1)
Sodium: 137 mmol/L (ref 135–145)
Total Bilirubin: 0.4 mg/dL (ref 0.0–1.2)
Total Protein: 7.3 g/dL (ref 6.5–8.1)

## 2023-06-18 LAB — PREGNANCY, URINE: Preg Test, Ur: NEGATIVE

## 2023-06-18 MED ORDER — DIPHENHYDRAMINE HCL 50 MG/ML IJ SOLN
12.5000 mg | Freq: Once | INTRAMUSCULAR | Status: AC
  Administered 2023-06-18: 12.5 mg via INTRAVENOUS
  Filled 2023-06-18: qty 1

## 2023-06-18 MED ORDER — POTASSIUM CHLORIDE CRYS ER 20 MEQ PO TBCR
40.0000 meq | EXTENDED_RELEASE_TABLET | Freq: Once | ORAL | Status: AC
Start: 2023-06-18 — End: 2023-06-18
  Administered 2023-06-18: 40 meq via ORAL
  Filled 2023-06-18: qty 2

## 2023-06-18 MED ORDER — ACETAMINOPHEN 325 MG PO TABS
650.0000 mg | ORAL_TABLET | Freq: Once | ORAL | Status: AC
  Administered 2023-06-18: 650 mg via ORAL
  Filled 2023-06-18: qty 2

## 2023-06-18 MED ORDER — LACTATED RINGERS IV BOLUS
1000.0000 mL | Freq: Once | INTRAVENOUS | Status: AC
  Administered 2023-06-18: 1000 mL via INTRAVENOUS

## 2023-06-18 MED ORDER — KETOROLAC TROMETHAMINE 15 MG/ML IJ SOLN
15.0000 mg | Freq: Once | INTRAMUSCULAR | Status: AC
  Administered 2023-06-18: 15 mg via INTRAVENOUS
  Filled 2023-06-18: qty 1

## 2023-06-18 MED ORDER — PROCHLORPERAZINE EDISYLATE 10 MG/2ML IJ SOLN
10.0000 mg | Freq: Once | INTRAMUSCULAR | Status: AC
  Administered 2023-06-18: 10 mg via INTRAVENOUS
  Filled 2023-06-18: qty 2

## 2023-06-18 NOTE — Discharge Instructions (Signed)
 Test results today are reassuring.  Take over-the-counter ibuprofen  and Tylenol  as needed for pain.  Stay hydrated.  Try to get plenty of sleep.  If you have recurrent or persistent symptoms, there is a telephone number below that you can call for follow-up appointment with a neurologist.  Return to the emergency department for any new or worsening symptoms of concern.

## 2023-06-18 NOTE — ED Provider Notes (Signed)
 [place] EMERGENCY DEPARTMENT AT [facility] Provider Note   CSN: [id number] Arrival date & time: 06/17/23  1930     History  Chief Complaint  Patient presents with   Headache    [name] [name] is a 48 y.o. female.   Headache Associated symptoms: nausea and photophobia   Patient presents for headache.  3 days ago, she woke up with a generalized headache.  Headache has been persistent.  It waxes and wanes in severity.  She has been taking ibuprofen  and Tylenol  for relief of the pain.  Because of the pain, she has had poor sleep lately.  She has had some nausea without vomiting.  She has had photosensitivity and noise sensitivity.  She denies any other recent symptoms.     Home Medications Prior to Admission medications   Medication Sig Start Date End Date Taking? Authorizing Provider  hydrochlorothiazide (MICROZIDE) 12.5 MG capsule Take 12.5 mg by mouth daily.    [provider]  oxyCODONE -acetaminophen  (PERCOCET/ROXICET) 5-325 MG per tablet Take 1-2 tablets by mouth every 4 (four) hours as needed (for pain scale less than 7). Patient not taking: Reported on 12/08/2014 06/08/14   Gorge Ade, MD  Prenatal Vit-Fe Fumarate-FA (PRENATAL MULTIVITAMIN) TABS tablet Take 1 tablet by mouth daily at 12 noon.    [provider]  trimethoprim -polymyxin b  (POLYTRIM ) ophthalmic solution Place 1 drop into both eyes every 6 (six) hours. 12/08/14   Smith, Kristi M, MD  valACYclovir (VALTREX) 1000 MG tablet Take 500 mg by mouth 2 (two) times daily.    [provider]      Allergies    Patient has no known allergies.    Review of Systems   Review of Systems  Eyes:  Positive for photophobia.  Gastrointestinal:  Positive for nausea.  Neurological:  Positive for headaches.  All other systems reviewed and are negative.   Physical Exam Updated Vital Signs BP 126/74   Pulse (!) 58   Temp 97.6 F (36.4 C)   Resp 16   LMP 06/10/2023 (Approximate)    SpO2 100%  Physical Exam Vitals and nursing note reviewed.  Constitutional:      General: She is not in acute distress.    Appearance: She is well-developed. She is not ill-appearing, toxic-appearing or diaphoretic.  HENT:     Head: Normocephalic and atraumatic.     Mouth/Throat:     Mouth: Mucous membranes are moist.  Eyes:     Extraocular Movements: Extraocular movements intact.     Conjunctiva/sclera: Conjunctivae normal.  Cardiovascular:     Rate and Rhythm: Normal rate and regular rhythm.  Pulmonary:     Effort: Pulmonary effort is normal. No respiratory distress.  Abdominal:     Palpations: Abdomen is soft.  Musculoskeletal:        General: Normal range of motion.     Cervical back: Neck supple.  Skin:    General: Skin is warm and dry.  Neurological:     Mental Status: She is alert and oriented to person, place, and time.     Cranial Nerves: No cranial nerve deficit, dysarthria or facial asymmetry.     Sensory: No sensory deficit.     Motor: No weakness.     Coordination: Coordination normal.  Psychiatric:        Mood and Affect: Mood normal.        Behavior: Behavior normal.     ED Results / Procedures / Treatments   Labs (all labs ordered  are listed, but only abnormal results are displayed) Labs Reviewed  COMPREHENSIVE METABOLIC PANEL - Abnormal; Notable for the following components:      Result Value   Potassium 3.3 (*)    Glucose, Bld 100 (*)    Calcium 8.7 (*)    AST 12 (*)    All other components within normal limits  RESP PANEL BY RT-PCR (RSV, FLU A&B, COVID)  RVPGX2  PREGNANCY, URINE  CBC WITH DIFFERENTIAL/PLATELET    EKG None  Radiology CT HEAD WO CONTRAST Result Date: 06/18/2023 CLINICAL DATA:  Headache, increasing frequency or severity. EXAM: CT HEAD WITHOUT CONTRAST TECHNIQUE: Contiguous axial images were obtained from the base of the skull through the vertex without intravenous contrast. RADIATION DOSE REDUCTION: This exam was performed  according to the departmental dose-optimization program which includes automated exposure control, adjustment of the mA and/or kV according to patient size and/or use of iterative reconstruction technique. COMPARISON:  None Available. FINDINGS: Brain: No evidence of acute infarction, hemorrhage, hydrocephalus, extra-axial collection or mass lesion/mass effect. Partially empty sella with low-density expanded sella, nonspecific isolation. Vascular: No hyperdense vessel or unexpected calcification. Skull: Normal. Negative for fracture or focal lesion. Sinuses/Orbits: No acute finding. IMPRESSION: 1. No acute finding. 2. Partially empty sella, correlate for pseudotumor risk factors in the setting of headache. Electronically Signed   By: Dorn Roulette M.D.   On: 06/18/2023 04:17    Procedures Procedures    Medications Ordered in ED Medications  acetaminophen  (TYLENOL ) tablet 650 mg (650 mg Oral Given 06/18/23 0100)  ketorolac  (TORADOL ) 15 MG/ML injection 15 mg (15 mg Intravenous Given 06/18/23 0346)  prochlorperazine  (COMPAZINE ) injection 10 mg (10 mg Intravenous Given 06/18/23 0346)  diphenhydrAMINE  (BENADRYL ) injection 12.5 mg (12.5 mg Intravenous Given 06/18/23 0345)  lactated ringers  bolus 1,000 mL (1,000 mLs Intravenous New Bag/Given 06/18/23 0346)  potassium chloride  SA (KLOR-CON  M) CR tablet 40 mEq (40 mEq Oral Given 06/18/23 0447)    ED Course/ Medical Decision Making/ A&P                                 Medical Decision Making Amount and/or Complexity of Data Reviewed Labs: ordered. Radiology: ordered.  Risk OTC drugs. Prescription drug management.   Patient presenting for generalized headache for the past 3 days.  This has caused her to have poor sleep.  On exam, patient is overall well-appearing.  She has a raspy voice but denies any recent URI.  She states that she has had headaches in the past which she attributed to dehydration.  She currently endorses ongoing generalized headache.  She  has no focal neurologic deficits on exam.  While in the ED, she was found to have low-grade temperature of 100.2 degrees.  COVID and flu testing were negative.  Will check lab work and CT head.  Headache cocktail was ordered.  Lab work was notable for mild hypokalemia.  Replacement potassium was ordered.  CT scan showed partially empty sella.  Patient has not had any recent visual disturbances.  I have low suspicion for IIH.  She did have improved headache while in the ED.  She was discharged in stable condition.        Final Clinical Impression(s) / ED Diagnoses Final diagnoses:  Bad headache    Rx / DC Orders ED Discharge Orders     None         Melvenia Motto, MD 06/18/23 [phone]

## 2023-06-18 NOTE — ED Notes (Signed)
 Pt transported to CT ?
